# Patient Record
Sex: Female | Born: 1940 | Race: White | Hispanic: No | Marital: Married | Smoking: Former smoker
Health system: Southern US, Community
[De-identification: ages and names within clinical notes are randomized; demographics above are authoritative.]

## PROBLEM LIST (undated history)

## (undated) DIAGNOSIS — N200 Calculus of kidney: Secondary | ICD-10-CM

## (undated) DIAGNOSIS — I10 Essential (primary) hypertension: Secondary | ICD-10-CM

## (undated) DIAGNOSIS — M87076 Idiopathic aseptic necrosis of unspecified foot: Secondary | ICD-10-CM

## (undated) DIAGNOSIS — J80 Acute respiratory distress syndrome: Secondary | ICD-10-CM

## (undated) DIAGNOSIS — IMO0001 Reserved for inherently not codable concepts without codable children: Secondary | ICD-10-CM

## (undated) DIAGNOSIS — J13 Pneumonia due to Streptococcus pneumoniae: Secondary | ICD-10-CM

## (undated) DIAGNOSIS — K219 Gastro-esophageal reflux disease without esophagitis: Secondary | ICD-10-CM

## (undated) DIAGNOSIS — R002 Palpitations: Secondary | ICD-10-CM

## (undated) HISTORY — DX: Calculus of kidney: N20.0

## (undated) HISTORY — DX: Acute respiratory distress syndrome: J80

## (undated) HISTORY — DX: Essential (primary) hypertension: I10

## (undated) HISTORY — PX: NASAL SINUS SURGERY: SHX719

## (undated) HISTORY — DX: Pneumonia due to Streptococcus pneumoniae: J13

## (undated) HISTORY — PX: COLONOSCOPY: SHX174

## (undated) HISTORY — DX: Gastro-esophageal reflux disease without esophagitis: K21.9

## (undated) HISTORY — DX: Palpitations: R00.2

## (undated) HISTORY — PX: TONSILLECTOMY: SUR1361

## (undated) HISTORY — DX: Reserved for inherently not codable concepts without codable children: IMO0001

## (undated) HISTORY — DX: Idiopathic aseptic necrosis of unspecified foot: M87.076

---

## 1997-08-05 ENCOUNTER — Other Ambulatory Visit: Admission: RE | Admit: 1997-08-05 | Discharge: 1997-08-05 | Payer: Self-pay | Admitting: Obstetrics & Gynecology

## 1998-10-06 ENCOUNTER — Other Ambulatory Visit: Admission: RE | Admit: 1998-10-06 | Discharge: 1998-10-06 | Payer: Self-pay | Admitting: Obstetrics and Gynecology

## 1999-12-04 ENCOUNTER — Other Ambulatory Visit: Admission: RE | Admit: 1999-12-04 | Discharge: 1999-12-04 | Payer: Self-pay | Admitting: Obstetrics and Gynecology

## 2000-09-17 ENCOUNTER — Encounter: Payer: Self-pay | Admitting: Gastroenterology

## 2000-09-17 ENCOUNTER — Encounter: Admission: RE | Admit: 2000-09-17 | Discharge: 2000-09-17 | Payer: Self-pay | Admitting: Gastroenterology

## 2000-12-04 ENCOUNTER — Other Ambulatory Visit: Admission: RE | Admit: 2000-12-04 | Discharge: 2000-12-04 | Payer: Self-pay | Admitting: Obstetrics and Gynecology

## 2001-12-16 ENCOUNTER — Other Ambulatory Visit: Admission: RE | Admit: 2001-12-16 | Discharge: 2001-12-16 | Payer: Self-pay | Admitting: Obstetrics and Gynecology

## 2002-09-21 ENCOUNTER — Encounter: Payer: Self-pay | Admitting: Internal Medicine

## 2002-09-24 ENCOUNTER — Ambulatory Visit (HOSPITAL_COMMUNITY): Admission: RE | Admit: 2002-09-24 | Discharge: 2002-09-24 | Payer: Self-pay | Admitting: Gastroenterology

## 2002-09-24 ENCOUNTER — Encounter (INDEPENDENT_AMBULATORY_CARE_PROVIDER_SITE_OTHER): Payer: Self-pay | Admitting: Specialist

## 2002-10-01 ENCOUNTER — Encounter: Admission: RE | Admit: 2002-10-01 | Discharge: 2002-10-01 | Payer: Self-pay | Admitting: Gastroenterology

## 2002-10-01 ENCOUNTER — Encounter: Payer: Self-pay | Admitting: Gastroenterology

## 2002-11-24 ENCOUNTER — Ambulatory Visit (HOSPITAL_COMMUNITY): Admission: RE | Admit: 2002-11-24 | Discharge: 2002-11-24 | Payer: Self-pay | Admitting: Gastroenterology

## 2002-11-25 ENCOUNTER — Encounter: Admission: RE | Admit: 2002-11-25 | Discharge: 2002-11-25 | Payer: Self-pay | Admitting: Gastroenterology

## 2002-12-18 ENCOUNTER — Other Ambulatory Visit: Admission: RE | Admit: 2002-12-18 | Discharge: 2002-12-18 | Payer: Self-pay | Admitting: Obstetrics and Gynecology

## 2003-12-30 ENCOUNTER — Ambulatory Visit: Payer: Self-pay | Admitting: Internal Medicine

## 2004-01-13 ENCOUNTER — Other Ambulatory Visit: Admission: RE | Admit: 2004-01-13 | Discharge: 2004-01-13 | Payer: Self-pay | Admitting: Obstetrics and Gynecology

## 2004-02-10 ENCOUNTER — Ambulatory Visit: Payer: Self-pay | Admitting: Internal Medicine

## 2004-03-17 ENCOUNTER — Ambulatory Visit (HOSPITAL_COMMUNITY): Admission: RE | Admit: 2004-03-17 | Discharge: 2004-03-17 | Payer: Self-pay | Admitting: Gastroenterology

## 2004-03-17 ENCOUNTER — Encounter (INDEPENDENT_AMBULATORY_CARE_PROVIDER_SITE_OTHER): Payer: Self-pay | Admitting: Specialist

## 2005-05-02 ENCOUNTER — Ambulatory Visit: Payer: Self-pay | Admitting: Internal Medicine

## 2007-02-17 ENCOUNTER — Telehealth (INDEPENDENT_AMBULATORY_CARE_PROVIDER_SITE_OTHER): Payer: Self-pay | Admitting: *Deleted

## 2007-05-31 ENCOUNTER — Encounter: Payer: Self-pay | Admitting: Internal Medicine

## 2007-05-31 DIAGNOSIS — J479 Bronchiectasis, uncomplicated: Secondary | ICD-10-CM

## 2007-07-03 ENCOUNTER — Encounter: Admission: RE | Admit: 2007-07-03 | Discharge: 2007-07-03 | Payer: Self-pay | Admitting: Orthopedic Surgery

## 2008-01-09 ENCOUNTER — Telehealth: Payer: Self-pay | Admitting: Internal Medicine

## 2008-01-12 ENCOUNTER — Ambulatory Visit: Payer: Self-pay | Admitting: Internal Medicine

## 2008-01-19 ENCOUNTER — Telehealth (INDEPENDENT_AMBULATORY_CARE_PROVIDER_SITE_OTHER): Payer: Self-pay | Admitting: *Deleted

## 2008-03-25 ENCOUNTER — Telehealth (INDEPENDENT_AMBULATORY_CARE_PROVIDER_SITE_OTHER): Payer: Self-pay | Admitting: *Deleted

## 2008-05-04 ENCOUNTER — Telehealth (INDEPENDENT_AMBULATORY_CARE_PROVIDER_SITE_OTHER): Payer: Self-pay | Admitting: *Deleted

## 2008-05-12 ENCOUNTER — Telehealth (INDEPENDENT_AMBULATORY_CARE_PROVIDER_SITE_OTHER): Payer: Self-pay | Admitting: *Deleted

## 2008-12-15 ENCOUNTER — Telehealth: Payer: Self-pay | Admitting: Internal Medicine

## 2009-01-20 ENCOUNTER — Ambulatory Visit: Payer: Self-pay | Admitting: Internal Medicine

## 2009-07-19 ENCOUNTER — Telehealth (INDEPENDENT_AMBULATORY_CARE_PROVIDER_SITE_OTHER): Payer: Self-pay | Admitting: *Deleted

## 2009-07-21 ENCOUNTER — Ambulatory Visit: Payer: Self-pay | Admitting: Internal Medicine

## 2009-07-22 ENCOUNTER — Telehealth (INDEPENDENT_AMBULATORY_CARE_PROVIDER_SITE_OTHER): Payer: Self-pay | Admitting: *Deleted

## 2009-10-10 ENCOUNTER — Encounter: Payer: Self-pay | Admitting: Internal Medicine

## 2009-11-17 ENCOUNTER — Ambulatory Visit: Payer: Self-pay | Admitting: Internal Medicine

## 2009-11-17 DIAGNOSIS — R9431 Abnormal electrocardiogram [ECG] [EKG]: Secondary | ICD-10-CM

## 2009-11-17 DIAGNOSIS — R002 Palpitations: Secondary | ICD-10-CM

## 2009-12-12 ENCOUNTER — Ambulatory Visit: Payer: Self-pay

## 2009-12-12 ENCOUNTER — Ambulatory Visit: Payer: Self-pay | Admitting: Cardiovascular Disease

## 2009-12-12 ENCOUNTER — Encounter: Payer: Self-pay | Admitting: Internal Medicine

## 2009-12-12 ENCOUNTER — Ambulatory Visit (HOSPITAL_COMMUNITY)
Admission: RE | Admit: 2009-12-12 | Discharge: 2009-12-12 | Payer: Self-pay | Source: Home / Self Care | Admitting: Internal Medicine

## 2010-02-19 ENCOUNTER — Encounter: Payer: Self-pay | Admitting: Gastroenterology

## 2010-03-02 NOTE — Letter (Signed)
Summary: [facility] Echo, Exercise Stress Test, 12 Lead EKG 2004-20  [facility] Echo, Exercise Stress Test, 12 Lead EKG 2004-2005   Imported By: Roderic Ovens 11/18/2009 10:54:54  _____________________________________________________________________  External Attachment:    Type:   Image     Comment:   External Document

## 2010-03-02 NOTE — Letter (Signed)
Summary: [facility] Annual Physical Exam Note   [facility] Annual Physical Exam Note   Imported By: Roderic Ovens 11/17/2009 13:27:15  _____________________________________________________________________  External Attachment:    Type:   Image     Comment:   External Document

## 2010-03-02 NOTE — Progress Notes (Signed)
Summary: refills  Phone Note Call from Patient Call back at Home Phone [phone]   Caller: Patient Call For: wert Reason for Call: Refill Medication Summary of Call: Needs refills on advair 250-22mcg(generic), advair 100-7mcg(generic).Fax to [facility] at [phone] and use order [phone]. Initial call taken by: Darletta Moll,  July 19, 2009 4:52 PM  Follow-up for Phone Call        called and spoke with pt and informed her unable to send RX to pharmacy as we haven't seen her since 2007!!!!!!  Pt states "discuss this with DR Sherene Sires as this is a Personnel officer and he will send rx for me."  Will forward message to MW to address.  Please advise.  Aundra Millet Reynolds LPN  July 19, 2009 5:06 PM  It's fine, she's following up with Dr Alwyn Ren who could also refill it next time so she doesn't doesn't have to jump over  these hurdles Follow-up by: Nyoka Cowden MD,  July 20, 2009 8:41 AM  Additional Follow-up for Phone Call Additional follow up Details #1::        Ginny simply needs to make appt with Kathlene November or me. The Medical Board will not allow refills without F/U Additional Follow-up by: Marga Melnick MD,  July 20, 2009 8:52 AM    Additional Follow-up for Phone Call Additional follow up Details #2::    Spoke with pt and advised per Drs Alwyn Ren and Sherene Sires, we need to just sched her an appt for these refills.  Pt states that she will have to look at her sched and will call back when is ready to sched appt. Follow-up by: Vernie Murders,  July 20, 2009 9:38 AM

## 2010-03-02 NOTE — Progress Notes (Signed)
Summary: [facility] Medical History/Med List   [facility] Medical History/Med List   Imported By: Roderic Ovens 11/17/2009 14:20:33  _____________________________________________________________________  External Attachment:    Type:   Image     Comment:   External Document

## 2010-03-02 NOTE — Progress Notes (Signed)
  Phone Note Call from Patient   Caller: Patient Summary of Call: pt calling to give the correct order number which is [phone] for advair 100/50 and 250/50 Initial call taken by: Rickard Patience,  July 22, 2009 1:39 PM  Follow-up for Phone Call        Rx was refaxed with the correct confirmation #.  Pt aware. Follow-up by: Vernie Murders,  July 22, 2009 2:35 PM

## 2010-03-02 NOTE — Assessment & Plan Note (Signed)
Summary: nep/qt changes/jml   Visit Type:  new pt Primary Jaime Fitzpatrick:  Dr. Felipa Eth  CC:  shortness of breath.  History of Present Illness: Jaime Fitzpatrick is seen at the request of Dr.AVVA because of palpitations and an abnormal electrocardiogram.  She is a complex past medical history. 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 She suffered developed bronchiectasis.  She has a history of "mitral valve prolapse" which was poopooed by Dr. Katrinka Blazing when he saw an echo on her in 2004 at which time she also had a Myoview scan done; as best as we know these are normal these results are pending at this time.  She has dyspnea on exertion which has been intubated to her lung disease. She is vague nonspecific chest tightness which is also described to her lung disease; it is not necessarily exertional. She is more recently developed palpitations which he describes as a thud.  It is associated with a pause in her heartbeat. It is not associated with a fluttering sensation. It is much more noticeable when she is recumbent or on her left side and at night.  She does not and has not use caffeine for many many years dating back to the initial diagnosis of mitral valve prolapse in the 1980s. At that time she was tried on Inderal which he took for a number of months and a combination of the medication and the discontinuation of caffeine resulted in resolution of her symptoms.  Current Medications (verified): 1)  Advair Diskus 250-50 Mcg/dose Misc (Fluticasone-Salmeterol) .... Inhale 1 Puff As Directed Once A Day 2)  Micardis 80 Mg Tabs (Telmisartan) .Marland Kitchen.. 1 Once Daily 3)  Nasonex 50 Mcg/act  Susp (Mometasone Furoate) .Marland Kitchen.. 1 To 2 Sprays Each Nostril At Bedtime 4)  Glucosamine Chondroitin Adv   Tabs (Misc Natural Products) .Marland Kitchen.. 1 Two Times A Day 5)  Adult Aspirin Ec Low  Strength 81 Mg  Tbec (Aspirin) .Marland Kitchen.. 1 Once Daily 6)  Advair Diskus 100-50 Mcg/dose Misc (Fluticasone-Salmeterol) .Marland Kitchen.. 1 Puff At Bedtime 7)  Mucinex 600 Mg Xr12h-Tab (Guaifenesin) .... 2 Two Times A Day 8)  Advair Diskus 100-50 Mcg/dose  Misc (Fluticasone-Salmeterol) .... One Puff Twice Daily 9)  Daily Multiple Vitamins  Tabs (Multiple Vitamin) .... Once Daily 10)  B Complex  Tabs (B Complex Vitamins) .... Once Daily 11)  Poly-Vitamin  Soln (Pediatric Multiple Vit-Vit C) .... Once Daily 12)  Multi-Day/calcium/extra Iron  Tabs (Multiple Vitamins-Calcium) .... Once Daily 13)  Fish Oil 1000 Mg Caps (Omega-3 Fatty Acids) .... Once Daily 14)  Primsol 50 Mg/31ml Soln (Trimethoprim Hcl) .... As Needed  Allergies (verified): 1)  Amoxicillin (Amoxicillin) 2)  Erythromycin Ethylsuccinate 3)  Tylenol/codeine #3 (Acetaminophen-Codeine)  Past History:  Family History: Last updated: 07/21/2009 Heart dz- Father Allergies- Father  Social History: Last updated: 07/21/2009 Married Homemaker Former smoker.  Smoked approx 1 yr socailly in 1968. Social ETOH  Risk Factors: Smoking Status: quit (05/31/2007)  Past Medical History: Pneumococcal pneumonia ARDS Bronchiectasis Aseptic necrosis of the foot GE reflux disease Nephrolithiasis Palpitations hypertension Allergic diathesis  Health Maintenance.................................................................Marland KitchenMarland KitchenAvva      - Pneumovax 12/2008  Past Surgical History: Colonoscopy tonsillectomy Sinus surgery  Review of Systems       full review of systems was negative apart from a history of present illness and past medical history recurrent UTIs, asthma, and shoulder arthritis.  Vital Signs:  Patient  profile:   70 year old female Height:      69 inches Weight:      136.75 pounds BMI:     20.27 Pulse rate:   82 / minute BP sitting:   124 / 86  (left arm) Cuff size:   regular  Vitals Entered By: Caralee Ates CMA (November 17, 2009  3:17 PM)  Physical Exam  General:  Well developed, well nourishedolder Caucasian female appearing her stated age in no acute distress. Head:  normal HEENT Neck:  supple without thyromegaly 6-7 cm JVP carotids brisk and full without bruits Chest Wall:  without kyphosis scoliosis Lungs:  clear to auscultation Heart:  regular rate and rhythm with a 2-3/6 systolic murmur her lungs are right sternal border;  it is not clear that S2 split varies Abdomen:  soft with active bowel sounds there is a prominent epigastric mid line pulsation. It is unassociated with heart sounds. Msk:  patient wears a orthotic brace in her right leg because of aseptic necrosis of her bones and her right foot Pulses:  intact distal pulses Extremities:  no clubbing cyanosis or edema Neurologic:  grossly normal Skin:  warm and dry Cervical Nodes:  without adenopathy Psych:  engaging affect   Impression & Recommendations:  Problem # 1:  ELECTROCARDIOGRAM, ABNORMAL (ICD-794.31)  She has poor R-wave progression ; left axis and incomplete right bundle the lateral which was not evident on her referring physician Philip the cardiogram with purulent QT prolongation. 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 Her echo from 2004 is not yet available. We will plan to repeat it looking for left ventricular function as well as a bubble study looking to see whether there is evidence of an ASD of the secundum type.  in the absence of symptoms, her borderline QT prolongation I don't think is sufficient enough to justify pursuing a genetic diagnosis. It may be reasonable to try to restrict QT prolonging drugs. Her updated medication list for this problem includes:    Adult Aspirin Ec Low Strength 81 Mg Tbec (Aspirin) .Marland Kitchen... 1 once daily  Orders: Abdominal Aorta Duplex (Abd Aorta  Duplex) Echocardiogram (Echo)  Problem # 2:  ABDOMINAL MASS (ICD-789.30) her abdominal fullness which is pulsatile is unassociated with heart sounds but I still suspect that it is likely related to inferior displacement of the heart as noted above. There is no family history of aortic disease. We'll obtain an ultrasound  Problem # 3:  PALPITATIONS (ICD-785.1)  her palpitations sound as if they are PVCs. They are isolated and the arrhythmic. We can begin a low-dose beta blocker in the event that they continue to be perturbing Her updated medication list for this problem includes:    Adult Aspirin Ec Low Strength 81 Mg Tbec (Aspirin) .Marland Kitchen... 1 once daily  Orders: Abdominal Aorta Duplex (Abd Aorta Duplex) Echocardiogram (Echo)  Patient Instructions: 1)  Your physician recommends that you continue on your current medications as directed. Please refer to the Current Medication list given to you today. 2)  Your physician has requested that you have an abdominal aorta duplex. During this test, an ultrasound is used to evaluate the aorta. Allow 30 minutes for this exam. Do not eat after midnight the day before and avoid carbonated beverages. There are no restrictions or special instructions. 3)  Your physician has requested that you have an  echocardiogram with bubble study.  Echocardiography is a painless test that uses sound waves to create images of your heart. It provides your doctor with information about the size and shape of your heart and how well your heart's chambers and valves are working.  This procedure takes approximately one hour. There are no restrictions for this procedure.

## 2010-03-02 NOTE — Assessment & Plan Note (Signed)
Summary: Pulmonary/ f/u bronchiectasis   Primary Provider/Referring Provider:  Dr. Felipa Eth  CC:  Followup for adviar rx.  Pt last seen April 2007.  Pt states that her breathing has overall been okay.  She denies any complaints..  History of Present Illness: 33 yowf never regular smoker with Bronchiectasis/ab after ARDS age 70 from Pneumoccal sepsis.  July 21, 2009 ov doing well on unusual combo of advair 250 in am and 100 in pm.  Pt denies any significant sore throat, dysphagia, itching, sneezing,  nasal congestion or excess secretions,  fever, chills, sweats, unintended wt loss, pleuritic or exertional cp, hempoptysis, change in activity tolerance  orthopnea pnd or leg swelling  Pt also denies any obvious fluctuation in symptoms with weather or environmental change or other alleviating or aggravating factors.        Current Medications (verified): 1)  Advair Diskus 250-50 Mcg/dose Misc (Fluticasone-Salmeterol) .... Inhale 1 Puff As Directed Once A Day 2)  Micardis 80 Mg Tabs (Telmisartan) .Marland Kitchen.. 1 Once Daily 3)  Nasonex 50 Mcg/act  Susp (Mometasone Furoate) .Marland Kitchen.. 1 To 2 Sprays Each Nostril At Bedtime 4)  Glucosamine Chondroitin Adv   Tabs (Misc Natural Products) .Marland Kitchen.. 1 Two Times A Day 5)  Adult Aspirin Ec Low Strength 81 Mg  Tbec (Aspirin) .Marland Kitchen.. 1 Once Daily 6)  Advair Diskus 100-50 Mcg/dose Misc (Fluticasone-Salmeterol) .Marland Kitchen.. 1 Puff At Bedtime 7)  Mucinex 600 Mg Xr12h-Tab (Guaifenesin) .... 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  Smoked approx 1 yr socailly in 1968. Social ETOH  Review of Systems       The patient complains of shortness of breath with activity, productive cough, indigestion, and change in color of mucus.  The patient denies shortness of breath at rest, non-productive cough, coughing up blood, chest pain, irregular heartbeats, acid heartburn, loss of appetite, weight change, abdominal pain, difficulty swallowing, sore throat, tooth/dental problems, headaches, nasal congestion/difficulty breathing through nose, sneezing, itching, ear ache, anxiety, depression, hand/feet swelling, joint stiffness or pain, rash, and fever.    Vital Signs:  Patient profile:   70 year old female Height:      69 inches Weight:      141 pounds BMI:     20.90 O2 Sat:      97 % on Room air Temp:     97.7 degrees F oral Pulse rate:   86 / minute BP sitting:   114 / 70  (left arm)  Vitals Entered By: Vernie Murders (July 21, 2009 4:32 PM)  O2 Flow:  Room air  Physical Exam  Additional Exam:  142 > 141 July 21, 2009 HEENT mild turbinate edema.  Oropharynx no thrush or excess pnd or cobblestoning.  No JVD or cervical adenopathy. Mild accessory muscle hypertrophy. Trachea midline, nl thryroid. Chest was hyperinflated by percussion with diminished breath sounds and moderate increased exp time without wheeze. Hoover sign positive at mid inspiration. Regular rate and rhythm without murmur gallop or rub or increase P2 or edema.  Abd: no hsm, nl excursion. Ext warm without cyanosis or clubbing.     Impression & Recommendations:  Problem # 1:  BRONCHIECTASIS (ICD-494.0) Rx airways to optimize mucociliary  fxn with advair but probably only needs 100/50 two times a day .  Pneumovax at age 41 so probably not indicated again  F/u can be per Dr Felipa Eth and as needed in pulmonary clinic  Medications Added to Medication List This Visit: 1)  Advair Diskus 250-50 Mcg/dose Misc  (Fluticasone-salmeterol) .... Inhale 1 puff as directed once a day 2)  Micardis 80 Mg Tabs (Telmisartan) .Marland Kitchen.. 1 once daily 3)  Nasonex 50 Mcg/act Susp (Mometasone furoate) .Marland Kitchen.. 1 to 2 sprays each nostril at bedtime 4)  Glucosamine Chondroitin Adv Tabs (Misc natural products) .Marland Kitchen.. 1 two times a day 5)  Adult Aspirin Ec Low Strength 81 Mg Tbec (Aspirin) .Marland Kitchen.. 1 once daily 6)  Advair Diskus 100-50 Mcg/dose Misc (Fluticasone-salmeterol) .Marland Kitchen.. 1 puff at bedtime 7)  Mucinex 600 Mg Xr12h-tab (Guaifenesin) .... 2 two times a day 8)  Advair Diskus 100-50 Mcg/dose Misc (Fluticasone-salmeterol) .... One puff twice daily  Other Orders: Est. Patient Level III (16109) Prescription Created Electronically [phone])  Patient Instructions: 1)  Try advair 100/50 one twice daily and if flare then resume the 250/50 in am and 100/50 in pm 2)  Dr Felipa Eth can refill these in the future and just see [place] if not doing well on this regimen Prescriptions: ADVAIR DISKUS 100-50 MCG/DOSE  MISC (FLUTICASONE-SALMETEROL) One puff twice daily  #1 x 11   Entered and Authorized by:   Nyoka Cowden MD   Signed by:   Nyoka Cowden MD on 07/21/2009   Method used:   Electronically to        [facility]. [phone]* (retail)       [address]       [place], [place]  [address]       Ph: [phone]       Fax: [phone]   RxID:   [id number] ADVAIR DISKUS 100-50 MCG/DOSE MISC (FLUTICASONE-SALMETEROL) 1 puff at bedtime  #3 x 3   Entered and Authorized by:   Nyoka Cowden MD   Signed by:   Nyoka Cowden MD on 07/21/2009   Method used:   Printed then faxed to ...       [facility]  [place]. [phone]* (retail)       [address]       [place], [place]  [address]       Ph: [phone]       Fax: [phone]   RxID:   [id number] ADVAIR DISKUS 250-50 MCG/DOSE MISC (FLUTICASONE-SALMETEROL) Inhale 1 puff as directed once a day  #3 x 4   Entered and Authorized by:    Nyoka Cowden MD   Signed by:   Nyoka Cowden MD on 07/21/2009   Method used:   Printed then faxed to ...       [facility]  [place] [phone]* (retail)       [address]       [place], [place]  [address]       Ph: [phone]       Fax: [phone]   RxID:   [id number]

## 2010-06-16 NOTE — Op Note (Signed)
   Jaime Fitzpatrick, Jaime Fitzpatrick                        ACCOUNT NO.:  [id number]   MEDICAL RECORD NO.:  [id number]                   PATIENT TYPE:  AMB   LOCATION:  ENDO                                 FACILITY:  [facility]   PHYSICIAN:  Petra Kuba, M.D.                 DATE OF BIRTH:  [birth year]   DATE OF PROCEDURE:  09/24/2002  DATE OF DISCHARGE:                                 OPERATIVE REPORT   PROCEDURE:  Esophagogastroduodenoscopy with biopsy.   INDICATIONS:  Upper tract symptoms with abdominal pain.  Consent was signed  after risks, benefits, methods, options were thoroughly discussed multiple  times in the past.   MEDICATIONS:  Demerol 60 mg, Versed 6.   DESCRIPTION OF PROCEDURE:  The video endoscope was inserted by direct  vision.  The esophagus were normal.  In the distal esophagus was a tiny  hiatal hernia.  The scope passed into the stomach, advanced through the  antrum, where there was some very minimal antritis, advanced through a  normal pylorus into a normal duodenal bulb, around the C-loop to a normal  second portion of the duodenum.  The scope was withdrawn back to the bulb  and a good look there ruled out abnormalities in that location.  Scope was  withdrawn back to the stomach and retroflexed.  Annularis, cardia, fundus  were all normal.  Lesser and greater curve were normal on retroflexed  visualization.  Straight visualization in the stomach confirmed some minimal  gastritis, ruled out any other abnormalities.  Two biopsies of the antrum  and two of the proximal stomach were obtained to confirm the gastritis, rule  out Helicobacter.  Air was suctioned.  The scope was then slowly withdrawn.  Again a good look at the esophagus was normal.  The scope was removed.  The  patient tolerated the procedure well.  There was no obvious immediate  complication.   ENDOSCOPIC DIAGNOSES:  1. Tiny hiatal hernia.  2. Minimal antritis/gastritis as was biopsied.  3. Otherwise within  normal limits esophagogastroduodenoscopy.   PLAN:  Await pathology.  Continue Pepcid.  Upgrade to pump inhibitors p.r.n.  He had at one time abdominal ultrasound just to be sure gallbladder problems  are not playing a role.  Call me p.r.n.                                                Petra Kuba, M.D.    MEM/MEDQ  D:  09/24/2002  T:  09/24/2002  Job:  [id number]   cc:   Gita Kudo, M.D.  [address]  [place]  [place] [address]  Fax: [phone]

## 2010-06-16 NOTE — Op Note (Signed)
   NAMEANESSIA, OAKLAND                        ACCOUNT NO.:  [id number]   MEDICAL RECORD NO.:  [id number]                   PATIENT TYPE:  AMB   LOCATION:  ENDO                                 FACILITY:  [facility]   PHYSICIAN:  Petra Kuba, M.D.                 DATE OF BIRTH:  09-02-1940   DATE OF PROCEDURE:  11/24/2002  DATE OF DISCHARGE:                                 OPERATIVE REPORT   PROCEDURE:  Colonoscopy.   INDICATIONS:  Screening.  Consent was signed after risks, benefits, methods,  and options thoroughly discussed in the past.   MEDICINES USED:  Demerol 100 mg, Versed 10 mg.   DESCRIPTION OF PROCEDURE:  Rectal inspection was pertinent for external  hemorrhoids.  Digital exam was negative.  The video pediatric adjustable  colonoscope was inserted and with lots of difficulty was able to be advanced  to probably the hepatic flexure.  This did require multiple abdominal  pressures and rolling her in multiple positions.  Unfortunately, despite our  efforts of about an hour and a half, we could not advance any further.  We  did have multiple withdrawals to try to get the loops out and multiple  position changes.  On slow withdrawal, no abnormalities were seen.  The prep  was adequate.  There was minimal liquid stool that required washing and  suctioning.  Anorectal pull-through and retroflexion confirmed some small  hemorrhoids.  The scope was reinserted a short way up the left side of the  colon, air was suctioned, the scope removed.  The patient tolerated the  procedure well.  There was no obvious immediate complication.   ENDOSCOPIC DIAGNOSES:  1. Internal-external hemorrhoids.  2. Very tortuous, long, looping colon.  3. Exam to questionably the hepatic flexure.    PLAN:  Will consider air contrast barium enema or virtual colonoscopy,  unfortunately, having trouble setting up the later, or will wait a year  until more widely available and proceed at that juncture.   Otherwise, yearly  rectals and guaiacs per primary care or gynecology.                                                Petra Kuba, M.D.    MEM/MEDQ  D:  11/24/2002  T:  11/24/2002  Job:  [id number]   cc:   Charlaine Dalton. Sherene Sires, M.D. [facility]   Gita Kudo, M.D.  [address]  [place]  [place] [address]  Fax: [phone]

## 2010-06-16 NOTE — Op Note (Signed)
NAMEANASTYN, Jaime Fitzpatrick              ACCOUNT NO.:  [id number]   MEDICAL RECORD NO.:  [id number]          PATIENT TYPE:  AMB   LOCATION:  ENDO                         FACILITY:  [facility]   PHYSICIAN:  Petra Kuba, M.D.    DATE OF BIRTH:  February 22, 1940   DATE OF PROCEDURE:  03/17/2004  DATE OF DISCHARGE:                                 OPERATIVE REPORT   PROCEDURE:  Colonoscopy with polypectomy.   INDICATION:  The patient with history of abnormal virtual colonoscopy and an  unsuccessful colonoscopy in the past.  Want to repeat to rule out polyps.  Consent was signed after risks, benefits, methods, options thoroughly  discussed in the office in the past.   MEDICINES USED:  1.  Demerol 100.  2.  Versed 10.   DESCRIPTION OF PROCEDURE:  Rectal inspection was pertinent for external  hemorrhoids.  Digital exam was negative.  Video pediatric adjustable  colonoscope was inserted and again with a moderate amount of difficulty due  to a long, looping, tortuous colon with rolling her on her back and some  lower suprapubic pressure, we were able to advance around the colon to the  cecum.  On insertion, no abnormalities were seen.  The cecum was identified  by the appendiceal orifice and the ileocecal valve.  In fact, the scope was  inserted a short ways into the terminal ileum which was normal.  Photodocumentation was obtained.  The scope was slowly withdrawn.  Prep was  adequate.  There was some liquid stool that required washing and suctioning.  In the proximal ascending, one fold above the ileocecal valve, a small  pedunculated polyp was seen, snared, electrocautery applied, and the polyp  was removed, suctioned through the scope and collected in the trap.  There  was a nice, white coagulum without any residual polypoid tissue.  The scope  was further withdrawn.  In the mid transverse, there was a tiny polyp which  was hot biopsied x 1 and put in a separate container.  The scope was further  withdrawn.  No other polypoid lesions were seen as we slowly withdrew back  to the rectum.  When we fell back around a tortuous loop, we did try to  readvance around the curves to decrease chances of missing things.  Anorectal pull-through and retroflexion confirmed some small hemorrhoids.  The scope was straightened, readvanced a short ways up the left side of the  colon; air was suctioned, the scope removed.  The patient tolerated the  procedure well.  There was no obvious immediate complication.   ENDOSCOPIC DIAGNOSES:  1.  Internal/external hemorrhoids.  2.  Tortuous, long, looping colon.  3.  Descending with small polyps there.  4.  Mid transverse tiny polyp, hot biopsied.  5.  Otherwise, within normal limits to the terminal ileum.   PLAN:  Await pathology to determine future colonic screening.  Might  consider repeat virtual colonoscopy in 5 years if __________ covered and  widely utilized or a repeat colonoscopy at that point pending pathology and  happy to see back p.r.n.      MEM/MEDQ  D:  03/17/2004  T:  03/17/2004  Job:  [id number]   cc:   Larina Earthly, M.D.  [address]  [place]  [place] [address]  Fax: [phone]

## 2010-06-19 ENCOUNTER — Other Ambulatory Visit: Payer: Self-pay | Admitting: Gastroenterology

## 2010-06-23 ENCOUNTER — Ambulatory Visit
Admission: RE | Admit: 2010-06-23 | Discharge: 2010-06-23 | Disposition: A | Discharge status: Home / Self Care | Payer: Medicare Other | Source: Ambulatory Visit | Attending: Gastroenterology | Admitting: Gastroenterology

## 2010-06-23 MED ORDER — IOHEXOL 300 MG/ML  SOLN
100.0000 mL | Freq: Once | INTRAMUSCULAR | Status: AC | PRN
  Administered 2010-06-23: 100 mL via INTRAVENOUS

## 2010-10-23 ENCOUNTER — Telehealth: Payer: Self-pay | Admitting: Internal Medicine

## 2010-10-23 NOTE — Telephone Encounter (Signed)
LOV faxed to Dr.Avva @ [phone]   10/23/10/km

## 2011-01-01 ENCOUNTER — Telehealth: Payer: Self-pay | Admitting: Internal Medicine

## 2011-01-01 NOTE — Telephone Encounter (Signed)
Spoke with pt. She states takes avelox prn for bronchiectasis and about 2 wks ago she needed to take a round but it did not work well for her. Had to take [facility] and this helped. She is requesting to have a different abx called in to pharm. I advised needs appt since last seen over 1 yr ago. Appt sched for 01/09/11 at 9 am.

## 2011-01-08 ENCOUNTER — Encounter: Payer: Self-pay | Admitting: Internal Medicine

## 2011-01-09 ENCOUNTER — Encounter: Payer: Self-pay | Admitting: Internal Medicine

## 2011-01-09 ENCOUNTER — Ambulatory Visit (INDEPENDENT_AMBULATORY_CARE_PROVIDER_SITE_OTHER)
Admission: RE | Admit: 2011-01-09 | Discharge: 2011-01-09 | Disposition: A | Discharge status: Home / Self Care | Payer: Medicare Other | Source: Ambulatory Visit | Attending: Internal Medicine | Admitting: Internal Medicine

## 2011-01-09 ENCOUNTER — Ambulatory Visit (INDEPENDENT_AMBULATORY_CARE_PROVIDER_SITE_OTHER): Payer: Medicare Other | Admitting: Internal Medicine

## 2011-01-09 VITALS — BP 116/74 | HR 95 | Temp 98.1°F | Ht 69.0 in | Wt 137.6 lb

## 2011-01-09 DIAGNOSIS — J479 Bronchiectasis, uncomplicated: Secondary | ICD-10-CM

## 2011-01-09 MED ORDER — LEVOFLOXACIN 750 MG PO TABS: 750.0000 mg | ORAL_TABLET | Freq: Every day | ORAL | Status: AC

## 2011-01-09 NOTE — Patient Instructions (Signed)
Please remember to go to the  x-ray department downstairs for your tests - we will call you with the results when they are available.     

## 2011-01-09 NOTE — Progress Notes (Signed)
Subjective:     Patient ID: Jaime Fitzpatrick, female   DOB: Jul 18, 1940, 70 y.o.   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   July 21, 2009 ov doing well on unusual combo of advair 250 in am and 100 in pm.  rec no change rx  01/09/2011 ov/ Bilal Manzer cc no change mild doe, no limited. Intermittent flares of cough with green sputum, most recently abrupt onset while on trip to [place] with fever/maialise  did not respond to avelox, did fine with ketek from [place] source.    At present Sleeping ok without nocturnal  or early am exacerbation  of respiratory  c/o's or need for noct saba. Also denies any obvious fluctuation of symptoms with weather or environmental changes or other aggravating or alleviating factors except as outlined above      ROS  neg for  any significant sore throat, dysphagia, itching, sneezing,  nasal congestion or excess/ purulent secretions,  fever, chills, sweats, unintended wt loss, pleuritic or exertional cp, hempoptysis, orthopnea pnd or leg swelling.  Also denies presyncope, palpitations, heartburn, abdominal pain, nausea, vomiting, diarrhea  or change in bowel or urinary habits, dysuria,hematuria,  rash, arthralgias, visual complaints, headache, numbness weakness or ataxia.     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  ETOH socially   Review of Systems     Objective:   Physical Exam     Wt  141 July 21, 2009 >  137 01/09/2011  HEENT mild turbinate edema. Oropharynx no thrush or excess pnd or cobblestoning. No JVD or cervical  adenopathy. Mild accessory muscle hypertrophy. Trachea midline, nl thryroid. Chest was hyperinflated by percussion with diminished breath sounds and moderate increased exp time without wheeze. Hoover sign positive at mid inspiration. Regular rate and rhythm without murmur gallop or rub or increase P2 or edema. Abd: no hsm, nl excursion. Ext warm without cyanosis or clubbing.   CXR  01/09/2011 :  Marked pleuroparenchymal thickening at the left lung apex, greater than the right. There are no prior studies for comparison. Consider follow-up chest CT with contrast for complete evaluation. Findings may reflect scarring from prior infection. 2. Bronchial wall thickening at the lung bases likely corresponds to the bronchial wall thickening and bronchiectasis seen on prior chest CT.   Assessment:         Plan:

## 2011-01-10 ENCOUNTER — Encounter: Payer: Self-pay | Admitting: Internal Medicine

## 2011-01-10 NOTE — Assessment & Plan Note (Signed)
Adequate control on present rx, reviewed options for abx - the problem with Jaime Fitzpatrick is that is doesn't cover any of the gnr's assoc with typical bronchiectasis flares ? Pseudomonas worse case scenario but cipro doesn't cover Resistant pneumococci Discussed in detail all the  indications, usual  risks and alternatives  relative to the benefits with patient who agrees to proceed with bronchoscopy with short course high dose levaquin with next flare  In meantime, the obstructive and inflammatory component of the problem appear well controlled with advair albeit in an unusual dosing pattern, also reviewed    Each maintenance medication was reviewed in detail including most importantly the difference between maintenance and as needed and under what circumstances the prns are to be used.  Please see instructions for details which were reviewed in writing and the patient given a copy.

## 2011-04-12 ENCOUNTER — Ambulatory Visit (INDEPENDENT_AMBULATORY_CARE_PROVIDER_SITE_OTHER): Payer: Medicare Other | Admitting: Internal Medicine

## 2011-04-12 ENCOUNTER — Ambulatory Visit (INDEPENDENT_AMBULATORY_CARE_PROVIDER_SITE_OTHER)
Admission: RE | Admit: 2011-04-12 | Discharge: 2011-04-12 | Disposition: A | Discharge status: Home / Self Care | Payer: Medicare Other | Source: Ambulatory Visit | Attending: Internal Medicine | Admitting: Internal Medicine

## 2011-04-12 ENCOUNTER — Telehealth: Payer: Self-pay | Admitting: Internal Medicine

## 2011-04-12 ENCOUNTER — Encounter: Payer: Self-pay | Admitting: Internal Medicine

## 2011-04-12 VITALS — BP 118/74 | HR 86 | Temp 98.0°F | Ht 69.0 in | Wt 141.0 lb

## 2011-04-12 DIAGNOSIS — J479 Bronchiectasis, uncomplicated: Secondary | ICD-10-CM

## 2011-04-12 NOTE — Telephone Encounter (Signed)
I spoke with patient and advised her of MW recs. She voiced her understanding and had no questions

## 2011-04-12 NOTE — Patient Instructions (Signed)
Stop fish oil because of the risk of some of it ending up in your lung  Hold aspirin whenever you see excessive amt of bleeding x 3 days off it.  Please remember to go to the  x-ray department downstairs for your tests - we will call you with the results when they are available.

## 2011-04-12 NOTE — Progress Notes (Signed)
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   July 21, 2009 ov doing well on unusual combo of advair 250 in am and 100 in pm.  rec no change rx   04/12/2011 f/u ov/Marcele Kosta cc cough with  intermittently brown mucus esp ins am assoc with streaky hemoptysis for the past 6 months- started as blood streaks and 2 days ago produced approx 1 tbs RBR.  No abx x 4 months. On asa 81 mg daily. No sign sob or need for daytime saba   At present Sleeping ok without nocturnal  or early am exacerbation  of respiratory  c/o's or need for noct saba. Also denies any obvious fluctuation of symptoms with weather or environmental changes or other aggravating or alleviating factors except as outlined above      ROS  neg for  any significant sore throat, dysphagia, itching, sneezing,  nasal congestion or excess/ purulent secretions,  fever, chills, sweats, unintended wt loss, pleuritic or exertional cp, hempoptysis, orthopnea pnd or leg swelling.  Also denies presyncope, palpitations, heartburn, abdominal pain, nausea, vomiting, diarrhea  or change in bowel or urinary habits, dysuria,hematuria,  rash, arthralgias, visual complaints, headache, numbness weakness or ataxia.     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  ETOH socially        Objective:   Physical Exam     Wt  141 July 21, 2009 >  137 01/09/2011 > 04/12/2011  141 HEENT mild turbinate edema. Oropharynx no thrush or excess pnd or cobblestoning. No JVD  or cervical adenopathy. Mild accessory muscle hypertrophy. Trachea midline, nl thryroid. Chest was hyperinflated by percussion with diminished breath sounds and moderate increased exp time without wheeze. Hoover sign positive at mid inspiration. Regular rate and rhythm without murmur gallop or rub or increase P2 or edema. Abd: no hsm, nl excursion. Ext warm without cyanosis or clubbing.  CXR  04/12/2011 :  Again noted bronchiectasis and bronchitic changes bilateral lower lobe. Extensive bilateral apical pleuroparenchymal thickening left greater than right again noted. Additional vague small nodules are noted in the right upper lobe. Further evaluation with enhanced CT scan is again recommended.      Assessment:         Plan:

## 2011-04-12 NOTE — Telephone Encounter (Signed)
I spoke with pt and she is wanting to know when she should start an abx bc she states she was not told. Also pt is wanting to know if Dr. Sherene Sires recs she get a TDAP booster. Please advise Dr. Sherene Sires, thanks

## 2011-04-12 NOTE — Telephone Encounter (Signed)
Ok to start levaquin now No tdap until 2015 due

## 2011-04-13 NOTE — Progress Notes (Signed)
Quick Note:  Spoke with pt and notified of results per Dr. Wert. Pt verbalized understanding and denied any questions.  ______ 

## 2011-04-14 NOTE — Assessment & Plan Note (Addendum)
Classic bronchiectasis flare on asa with intermittent hemoptysis and no def/convincing evolving changes on cxr to suggest MAI, though she is at risk.  I had an extended discussion with the patient today lasting 15 to 20 minutes of a 25 minute visit on the following issues:   Discussed limited dx and need to stop asa and rx with abx when sputum changes for the worse which it clearly has at present.  Also rec stop fish oil as there is risk some of it may well end of in her airways and there's no way to rule it out - offered alternative of two servings of baked fish per week which she agreed to consider.    Each maintenance medication was reviewed in detail including most importantly the difference between maintenance and as needed and under what circumstances the prns are to be used.  Please see instructions for details which were reviewed in writing and the patient given a copy.    See instructions for specific recommendations which were reviewed directly with the patient who was given a copy with highlighter outlining the key components.

## 2012-02-26 ENCOUNTER — Ambulatory Visit
Admission: RE | Admit: 2012-02-26 | Discharge: 2012-02-26 | Disposition: A | Discharge status: Home / Self Care | Payer: Medicare Other | Source: Ambulatory Visit | Attending: Pulmonary Disease | Admitting: Pulmonary Disease

## 2012-02-26 ENCOUNTER — Encounter: Payer: Self-pay | Admitting: Pulmonary Disease

## 2012-02-26 ENCOUNTER — Telehealth: Payer: Self-pay | Admitting: Internal Medicine

## 2012-02-26 ENCOUNTER — Ambulatory Visit (INDEPENDENT_AMBULATORY_CARE_PROVIDER_SITE_OTHER): Payer: Medicare Other | Admitting: Pulmonary Disease

## 2012-02-26 VITALS — BP 122/80 | HR 100 | Temp 98.6°F | Ht 69.0 in | Wt 139.2 lb

## 2012-02-26 DIAGNOSIS — J471 Bronchiectasis with (acute) exacerbation: Secondary | ICD-10-CM | POA: Insufficient documentation

## 2012-02-26 DIAGNOSIS — R042 Hemoptysis: Secondary | ICD-10-CM

## 2012-02-26 MED ORDER — LEVOFLOXACIN 750 MG PO TABS: 750.0000 mg | ORAL_TABLET | Freq: Every day | ORAL | Status: DC

## 2012-02-26 NOTE — Addendum Note (Signed)
Addended by: Nita Sells on: 02/26/2012 12:22 PM   Modules accepted: Orders

## 2012-02-26 NOTE — Telephone Encounter (Signed)
I spoke with pt. She stated she coughed up several tbl spoons of blood last night. Since MW is not in. Pt is scheduled to come in today at 11:15 to see [facility]. Nothing further was needed

## 2012-02-26 NOTE — Assessment & Plan Note (Signed)
The patient is having increased secretions and hemoptysis that may be associated with a bronchiectasis flare.  I would like to start her on a course of antibiotics, and asked her to continue her aggressive pulmonary toilet.

## 2012-02-26 NOTE — Progress Notes (Signed)
  Subjective:    Patient ID: Jaime Fitzpatrick, female    DOB: [birth year], 72 y.o.   MRN: [id number]  HPI The patient comes in today for an acute sick visit.  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  For about the last one year she has had intermittent episodes of streaky hemoptysis, but last night had an episode that was much more significant.  This was associated with purulent mucus, and was a large quantity of blood.  She brought a sample in a plastic bag today that was impressive.  She has not had any fever or increased shortness of breath.  She has been getting percussive therapy every night by her husband with good results.  She has not had a recent CT of her chest.   Review of Systems  Constitutional: Negative for fever and unexpected weight change.  HENT: Negative for ear pain, nosebleeds, congestion, sore throat, rhinorrhea, sneezing, trouble swallowing, dental problem, postnasal drip and sinus pressure.   Eyes: Negative for redness and itching.  Respiratory: Positive for cough ( hemoptysis) and wheezing. Negative for chest tightness and shortness of breath.   Cardiovascular: Negative for palpitations and leg swelling.  Gastrointestinal: Negative for nausea and vomiting.  Genitourinary: Negative for dysuria.  Musculoskeletal: Negative for joint swelling.  Skin: Negative for rash.  Neurological: Negative for headaches.  Hematological: Does not bruise/bleed easily.  Psychiatric/Behavioral: Positive for agitation. Negative for dysphoric mood. The patient is not nervous/anxious.        Objective:   Physical Exam Thin female in no acute distress Nose without purulence or discharge noted Oropharynx clear Chest with a few scattered crackles, but no wheezing Cardiac exam with regular rate and rhythm Lower extremities without edema, no cyanosis Alert and oriented, moves all 4 extremities.       Assessment & Plan:

## 2012-02-26 NOTE — Patient Instructions (Addendum)
Will treat you with a course of levaquin 750mg  one a day for 10 days for possible flare of your bronchiectasis Will check a scan of your chest given the quantity of blood in mucus.  Will call you with results. Consider vibratory vest as a treatment.  See brochure.  Please call if worsening of your symptoms.

## 2012-02-26 NOTE — Assessment & Plan Note (Signed)
The patient had an episode of hemoptysis last night that was significant, and the most severe that she has had yet.  I think it would be worthwhile to check a CT of her chest to see if she may have an aspergilloma, or possibly increased findings to suggest MAC colonization/infection.

## 2012-02-28 ENCOUNTER — Encounter: Payer: Self-pay | Admitting: Internal Medicine

## 2012-02-28 DIAGNOSIS — B479 Mycetoma, unspecified: Secondary | ICD-10-CM | POA: Insufficient documentation

## 2012-03-18 ENCOUNTER — Encounter: Payer: Self-pay | Admitting: Internal Medicine

## 2012-03-18 ENCOUNTER — Ambulatory Visit (INDEPENDENT_AMBULATORY_CARE_PROVIDER_SITE_OTHER): Payer: Medicare Other | Admitting: Internal Medicine

## 2012-03-18 VITALS — BP 140/90 | HR 117 | Temp 97.0°F | Ht 69.0 in | Wt 141.4 lb

## 2012-03-18 DIAGNOSIS — J479 Bronchiectasis, uncomplicated: Secondary | ICD-10-CM

## 2012-03-18 NOTE — Progress Notes (Signed)
Subjective:     Patient ID: Jaime Fitzpatrick, female   DOB: 1940-07-07    MRN: [id number]  HPI  34 yowf never regular smoker with Bronchiectasis/ab after ARDS age 72 from Pneumoccal pna/sepsis.   July 21, 2009 ov doing well on unusual combo of advair 250 in am and 100 in pm.  rec no change rx   04/12/2011 f/u ov/[name] cc cough with  intermittently brown mucus esp ins am assoc with streaky hemoptysis for the past 6 months- started as blood streaks and 2 days ago produced approx 1 tbs RBR.  No abx x 4 months. On asa 81 mg daily. No sign sob or need for daytime saba rec Stop fish oil because of the risk of some of it ending up in your lung Hold aspirin whenever you see excessive amt of bleeding x 3 days off it.     03/18/2012 f/u ov/[name] cc daily x years coughing up to 4 tbsp of brown mucus every am with traces of brb only with one episode of gross hemoptyis 02/26/12  x 1/3 cup better after levaquin x 10 days.   No obvious daytime variabilty or assoc sobor cp or chest tightness, subjective wheeze overt sinus or hb symptoms. No unusual exp hx or h/o childhood pna/ asthma or premature birth to his knowledge.    At present Sleeping ok without nocturnal  or early am exacerbation  of respiratory  c/o's or need for noct saba. Also denies any obvious fluctuation of symptoms with weather or environmental changes or other aggravating or alleviating factors except as outlined above     ROS  The following are not active complaints unless bolded sore throat, dysphagia, dental problems, itching, sneezing,  nasal congestion or excess/ purulent secretions, ear ache,   fever, chills, sweats, unintended wt loss, pleuritic or exertional cp, hemoptysis,  orthopnea pnd or leg swelling, presyncope, palpitations, heartburn, abdominal pain, anorexia, nausea, vomiting, diarrhea  or change in bowel or urinary habits, change in stools or urine, dysuria,hematuria,  rash, arthralgias, visual complaints, headache, numbness  weakness or ataxia or problems with walking or coordination,  change in mood/affect or memory.        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  ETOH socially        Objective:   Physical Exam     Wt  141 July 21, 2009 >  137 01/09/2011 > 04/12/2011  141 > 03/18/2012  141 HEENT mild turbinate edema. Oropharynx no thrush or excess pnd or cobblestoning. No JVD or cervical adenopathy. Mild accessory muscle hypertrophy. Trachea midline, nl thryroid. Chest was hyperinflated by percussion with diminished breath sounds and moderate increased exp time without wheeze. Hoover sign positive at mid inspiration. Regular rate and rhythm without murmur gallop or rub or increase P2 or edema. Abd: no hsm, nl excursion. Ext warm without cyanosis or clubbing.   CT chest 02/26/12 1. Severe chronic upper lobe bronchiectasis worse on the left.  2. Small cavitary lesion with central nodule within left upper  lobe may represent mycetoma.  3. Reticular nodular pattern within the right middle lobe suggest  chronic infection such as MAI  4. Irregular nodule in the left lung base  Assessment:         Plan:

## 2012-03-18 NOTE — Patient Instructions (Addendum)
Please see patient coordinator before you leave today  to schedule delivery of vest to be used at least twice daily to see if it helps clear the mucus better  Please schedule a follow up visit in 3 months but call sooner if needed with a cxr

## 2012-03-19 NOTE — Assessment & Plan Note (Addendum)
Reviewed ct chest with pt and husband from 02/26/12 1. Severe chronic upper lobe bronchiectasis worse on the left.  2. Small cavitary lesion with central nodule within left upper  lobe may represent mycetoma.  3. Reticular nodular pattern within the right middle lobe suggest  chronic infection such as MAI  4. Irregular nodule in the left lung base  I had an extended discussion with the patient today lasting 15 to 20 minutes of a 25 minute visit on the following issues:  Her bronchiectasis is poorly controlled with freq need for abx and excessive daily purulent mucus and now may be complicated by early mycetoma  She is poor surgical candidate based on extensive scarring but could get embolization Rx if hemoptysis worsens.  In meantime needs trial of vest which she clearly qualifies for.   See instructions for specific recommendations which were reviewed directly with the patient who was given a copy with highlighter outlining the key components.   Late Add: This pt has tried and failied to control her daily symptoms of excess sputum retention by using cpt from her husband, a retired physician, and with use of bronchodilators and flutter valve and mucinex

## 2012-03-27 ENCOUNTER — Telehealth: Payer: Self-pay | Admitting: Internal Medicine

## 2012-03-27 NOTE — Telephone Encounter (Signed)
Per Huntley Dec at [employer], before [employer] will cover the VEST, they will need to see documentation that the pt has tried and failed therapies , such as, flutter valve or manual cpt. Huntley Dec states that the physician can append his notes to include this information. The new information will need to be faxed to [phone]. MW, pls advise.

## 2012-03-28 NOTE — Telephone Encounter (Signed)
Note 03/18/12 amended

## 2012-03-28 NOTE — Telephone Encounter (Signed)
Amended ov note faxed to Huntley Dec at [facility].

## 2012-03-28 NOTE — Telephone Encounter (Signed)
p 

## 2012-03-28 NOTE — Telephone Encounter (Signed)
Amended note faxed to Huntley Dec at [employer].

## 2012-04-14 ENCOUNTER — Other Ambulatory Visit: Payer: Self-pay | Admitting: Internal Medicine

## 2012-06-18 ENCOUNTER — Ambulatory Visit: Payer: Medicare Other | Admitting: Internal Medicine

## 2012-06-20 ENCOUNTER — Encounter: Payer: Self-pay | Admitting: Internal Medicine

## 2012-06-20 ENCOUNTER — Ambulatory Visit (INDEPENDENT_AMBULATORY_CARE_PROVIDER_SITE_OTHER)
Admission: RE | Admit: 2012-06-20 | Discharge: 2012-06-20 | Disposition: A | Discharge status: Home / Self Care | Payer: Medicare Other | Source: Ambulatory Visit | Attending: Internal Medicine | Admitting: Internal Medicine

## 2012-06-20 ENCOUNTER — Ambulatory Visit (INDEPENDENT_AMBULATORY_CARE_PROVIDER_SITE_OTHER): Payer: Medicare Other | Admitting: Internal Medicine

## 2012-06-20 VITALS — BP 130/84 | HR 87 | Temp 96.0°F

## 2012-06-20 DIAGNOSIS — J479 Bronchiectasis, uncomplicated: Secondary | ICD-10-CM

## 2012-06-20 DIAGNOSIS — R042 Hemoptysis: Secondary | ICD-10-CM

## 2012-06-20 NOTE — Patient Instructions (Addendum)
Continue levaquin x 5 days for change in usual mucus   Please schedule a follow up visit in 3 months but call sooner if needed with cxr

## 2012-06-20 NOTE — Progress Notes (Signed)
Subjective:     Patient ID: Jaime Fitzpatrick, female   DOB: 11-Apr-1940    MRN: [id number]  HPI  72 yowf never regular smoker with Bronchiectasis/ab after ARDS age 72 from Pneumoccal pna/sepsis.   July 21, 2009 ov doing well on unusual combo of advair 250 in am and 100 in pm.  rec no change rx   04/12/2011 f/u ov/Saxon Barich cc cough with  intermittently brown mucus esp ins am assoc with streaky hemoptysis for the past 6 months- started as blood streaks and 2 days ago produced approx 1 tbs RBR.  No abx x 4 months. On asa 81 mg daily. No sign sob or need for daytime saba rec Stop fish oil because of the risk of some of it ending up in your lung Hold aspirin whenever you see excessive amt of bleeding x 3 days off it.     03/18/2012 f/u ov/Brandn Mcgath cc daily x years coughing up to 4 tbsp of brown mucus every am with traces of brb only with one episode of gross hemoptyis 02/26/12  x 1/3 cup better after levaquin x 10 days.  rec Start vest   06/20/2012 f/u ov/Marc Sivertsen re bronchiectasis maintainted on advair  Chief Complaint  Patient presents with  . Follow-up    Pt states breathing is doing well and her cough is about the same since her last visit.   traces of threads of blood in ams no worse with advil, not sure vest helping has not used levaquin cycle since previous ov.  Most of her mucus is only slt discolored, better effect with CPPD per husband than with vest.   No obvious daytime variabilty or assoc sob or cp or chest tightness, subjective wheeze overt sinus or hb symptoms. No unusual exp hx or h/o childhood pna/ asthma or premature birth to his knowledge.    At present Sleeping ok without nocturnal  or early am exacerbation  of respiratory  c/o's or need for noct saba. Also denies any obvious fluctuation of symptoms with weather or environmental changes or other aggravating or alleviating factors except as outlined above     Current Medications, Allergies, Past Medical History, Past Surgical History,  Family History, and Social History were reviewed in [employer] record.  ROS  The following are not active complaints unless bolded sore throat, dysphagia, dental problems, itching, sneezing,  nasal congestion or excess/ purulent secretions, ear ache,   fever, chills, sweats, unintended wt loss, pleuritic or exertional cp, hemoptysis,  orthopnea pnd or leg swelling, presyncope, palpitations, heartburn, abdominal pain, anorexia, nausea, vomiting, diarrhea  or change in bowel or urinary habits, change in stools or urine, dysuria,hematuria,  rash, arthralgias, visual complaints, headache, numbness weakness or ataxia or problems with walking or coordination,  change in mood/affect or memory.            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  ETOH socially        Objective:   Physical Exam  Baseline wt around 140 amb pleasant wf nad with nl vital signes  HEENT mild turbinate edema. Oropharynx no thrush or excess pnd or cobblestoning. No JVD or cervical adenopathy. Mild accessory muscle hypertrophy. Trachea midline, nl thryroid. Chest  was min hyperinflated by percussion with diminished breath sounds and moderate increased exp time with min rhonchi on exp L > R . Hoover sign positive at end inspiration. Regular rate and rhythm without murmur gallop or rub or increase P2 or edema. Abd: no hsm, nl excursion. Ext warm without cyanosis or clubbing.   CT chest 02/26/12 1. Severe chronic upper lobe bronchiectasis worse on the left.  2. Small cavitary lesion with central nodule within left upper  lobe may represent  mycetoma.  3. Reticular nodular pattern within the right middle lobe suggest  chronic infection such as MAI  4. Irregular nodule in the left lung base   CXR  06/20/2012 :  Biapical pleural parenchymal scarring, left greater than right, with scattered pulmonary parenchymal scarring and bronchiectasis    Assessment:         Plan:

## 2012-06-22 NOTE — Assessment & Plan Note (Addendum)
-   CT chest 02/26/12 1. Severe chronic upper lobe bronchiectasis worse on the left.  2. Small cavitary lesion with central nodule within left upper  lobe may represent mycetoma.  3. Reticular nodular pattern within the right middle lobe suggest  chronic infection such as MAI  4. Irregular nodule in the left lung base - Vest added 03/28/12   Adequate control on present rx, reviewed contingencies, for now will continue vest.    Each maintenance medication was reviewed in detail including most importantly the difference between maintenance and as needed and under what circumstances the prns are to be used.  Please see instructions for details which were reviewed in writing and the patient given a copy.

## 2012-06-22 NOTE — Assessment & Plan Note (Signed)
Minimal problem at present but concerned re very small ? Mycetoma in LUL > poor op candidate so best choice would probably be to attempt embolization if really flared and for now just treat the underlying dz (bronchiectasis)

## 2012-09-17 ENCOUNTER — Other Ambulatory Visit: Payer: Self-pay | Admitting: Internal Medicine

## 2012-09-17 DIAGNOSIS — J479 Bronchiectasis, uncomplicated: Secondary | ICD-10-CM

## 2012-09-18 ENCOUNTER — Encounter: Payer: Self-pay | Admitting: Internal Medicine

## 2012-09-18 ENCOUNTER — Ambulatory Visit (INDEPENDENT_AMBULATORY_CARE_PROVIDER_SITE_OTHER): Payer: Medicare Other | Admitting: Internal Medicine

## 2012-09-18 ENCOUNTER — Ambulatory Visit (INDEPENDENT_AMBULATORY_CARE_PROVIDER_SITE_OTHER)
Admission: RE | Admit: 2012-09-18 | Discharge: 2012-09-18 | Disposition: A | Discharge status: Home / Self Care | Payer: Medicare Other | Source: Ambulatory Visit | Attending: Internal Medicine | Admitting: Internal Medicine

## 2012-09-18 VITALS — BP 102/68 | HR 103 | Temp 97.3°F | Ht 68.5 in | Wt 135.0 lb

## 2012-09-18 DIAGNOSIS — J471 Bronchiectasis with (acute) exacerbation: Secondary | ICD-10-CM

## 2012-09-18 DIAGNOSIS — J479 Bronchiectasis, uncomplicated: Secondary | ICD-10-CM

## 2012-09-18 DIAGNOSIS — A429 Actinomycosis, unspecified: Secondary | ICD-10-CM

## 2012-09-18 DIAGNOSIS — B479 Mycetoma, unspecified: Secondary | ICD-10-CM

## 2012-09-18 NOTE — Patient Instructions (Signed)
Continue levaquin 750 x 5 days for really nasty mucus  Consider Prevnar next visit Dr Felipa Eth

## 2012-09-18 NOTE — Progress Notes (Signed)
Subjective:     Patient ID: Jaime Fitzpatrick, female   DOB: Oct 14, 1940    MRN: [id number]    Brief patient profile:  9 yowf never regular smoker with Bronchiectasis/ab after ARDS age 72 from Pneumoccal pna/sepsis.     HPI 04/12/2011 f/u ov/Jaime Fitzpatrick cc cough with  intermittently brown mucus esp ins am assoc with streaky hemoptysis for the past 6 months- started as blood streaks and 2 days ago produced approx 1 tbs RBR.  No abx x 4 months. On asa 81 mg daily. No sign sob or need for daytime saba rec Stop fish oil because of the risk of some of it ending up in your lung Hold aspirin whenever you see excessive amt of bleeding x 3 days off it.     03/18/2012 f/u ov/Jaime Fitzpatrick cc daily x years coughing up to 4 tbsp of brown mucus every am with traces of brb only with one episode of gross hemoptyis 02/26/12  x 1/3 cup better after levaquin x 10 days.  rec Start vest   06/20/2012 f/u ov/Jaime Fitzpatrick re bronchiectasis maintainted on advair  Chief Complaint  Patient presents with  . Follow-up    Pt states breathing is doing well and her cough is about the same since her last visit.   traces of threads of blood in ams no worse with advil, not sure vest helping has not used levaquin cycle since previous ov.  Most of her mucus is only slt discolored, better effect with CPPD per husband than with vest.  rec Continue levaquin x 5 days for change in usual mucus   09/18/2012 f/u ov/Jaime Fitzpatrick re ? Mycetoma LUL Chief Complaint  Patient presents with  . Follow-up    No changes. No complaints.    coughed up blood x 5 days in June while on vicodin, minimal p rx levaquin, no limiting sob     No obvious daytime variabilty or assoc sob or cp or chest tightness, subjective wheeze overt sinus or hb symptoms. No unusual exp hx or h/o childhood pna/ asthma or premature birth to his knowledge.    At present Sleeping ok without nocturnal  or early am exacerbation  of respiratory  c/o's or need for noct saba. Also denies any obvious  fluctuation of symptoms with weather or environmental changes or other aggravating or alleviating factors except as outlined above     Current Medications, Allergies, Past Medical History, Past Surgical History, Family History, and Social History were reviewed in [employer] record.  ROS  The following are not active complaints unless bolded sore throat, dysphagia, dental problems, itching, sneezing,  nasal congestion or excess/ purulent secretions, ear ache,   fever, chills, sweats, unintended wt loss, pleuritic or exertional cp, hemoptysis,  orthopnea pnd or leg swelling, presyncope, palpitations, heartburn, abdominal pain, anorexia, nausea, vomiting, diarrhea  or change in bowel or urinary habits, change in stools or urine, dysuria,hematuria,  rash, arthralgias, visual complaints, headache, numbness weakness or ataxia or problems with walking or coordination,  change in mood/affect or memory.            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  ETOH socially        Objective:  Physical Exam  Baseline wt around 140  Wt Readings from Last 3 Encounters:  09/18/12 135 lb (61.236 kg)  03/18/12 141 lb 6.4 oz (64.139 kg)  02/26/12 139 lb 3.2 oz (63.141 kg)    amb pleasant wf nad with nl vital signs HEENT mild turbinate edema. Oropharynx no thrush or excess pnd or cobblestoning. No JVD or cervical adenopathy. Mild accessory muscle hypertrophy. Trachea midline, nl thryroid. Chest was min hyperinflated by percussion with diminished breath sounds and moderate increased exp time with min rhonchi on exp L > R .  Hoover sign positive at end inspiration. Regular rate and rhythm without murmur gallop or rub or increase P2 or edema. Abd: no hsm, nl excursion. Ext warm without cyanosis or clubbing.     CT chest 02/26/12 1. Severe chronic upper lobe bronchiectasis worse on the left.  2. Small cavitary lesion with central nodule within left upper  lobe may represent mycetoma.  3. Reticular nodular pattern within the right middle lobe suggest  chronic infection such as MAI  4. Irregular nodule in the left lung base  CXR  09/18/2012 : 1. No change chronic pulmonary scarring at the bases and apices.  2. Left basilar bronchiectasis and left upper lobe bronchiectasis  and pleural thickening are unchanged.     Assessment:

## 2012-09-19 NOTE — Assessment & Plan Note (Signed)
Discussed limited options for hemoptysis with or without mycetoma first step would likely be to attempt coil per IR to interupt LUL bronchial blood supply

## 2012-09-19 NOTE — Assessment & Plan Note (Addendum)
-   CT chest 02/26/12 1. Severe chronic upper lobe bronchiectasis worse on the left.  2. Small cavitary lesion with central nodule within left upper  lobe may represent mycetoma.  3. Reticular nodular pattern within the right middle lobe suggest  chronic infection such as MAI  4. Irregular nodule in the left lung base - Vest added 03/28/12   would probably go ahead and give her Prevnar sp pneumoccal vaccination in 2010 > defer to Dr Felipa Eth as we have no prevnar here and cautioned this expense not covered by medicare  No evidence of expanding mycetoma > no change rx needed     Each maintenance medication was reviewed in detail including most importantly the difference between maintenance and as needed and under what circumstances the prns are to be used.  Please see instructions for details which were reviewed in writing and the patient given a copy.

## 2013-02-17 ENCOUNTER — Telehealth: Payer: Self-pay | Admitting: Internal Medicine

## 2013-02-17 NOTE — Telephone Encounter (Signed)
I called and spoke with Jaime Fitzpatrick. She reports Dr. Theda Sers from [facility] ortho is faxing over MRI of shoulder per Jaime Fitzpatrick this afternoon. She was called about 1 hr ago about results. Was told abnormality in her lymph node and also in the left lobe. She is calling as an FYI to let us know this is coming through. Will forward to MW as an Pharmacist, hospital

## 2013-05-04 ENCOUNTER — Other Ambulatory Visit: Payer: Self-pay | Admitting: Internal Medicine

## 2014-02-04 ENCOUNTER — Other Ambulatory Visit: Payer: Self-pay | Admitting: Dermatology

## 2014-06-09 ENCOUNTER — Other Ambulatory Visit: Payer: Self-pay | Admitting: Gastroenterology

## 2014-07-14 ENCOUNTER — Ambulatory Visit: Payer: Self-pay | Admitting: Surgery

## 2014-07-16 ENCOUNTER — Ambulatory Visit: Payer: Self-pay | Admitting: Internal Medicine

## 2014-07-16 ENCOUNTER — Other Ambulatory Visit: Payer: Self-pay | Admitting: Internal Medicine

## 2014-07-16 DIAGNOSIS — J479 Bronchiectasis, uncomplicated: Secondary | ICD-10-CM

## 2014-07-20 ENCOUNTER — Encounter: Payer: Self-pay | Admitting: Internal Medicine

## 2014-07-20 ENCOUNTER — Ambulatory Visit (INDEPENDENT_AMBULATORY_CARE_PROVIDER_SITE_OTHER): Payer: Medicare Other | Admitting: Internal Medicine

## 2014-07-20 ENCOUNTER — Ambulatory Visit (INDEPENDENT_AMBULATORY_CARE_PROVIDER_SITE_OTHER)
Admission: RE | Admit: 2014-07-20 | Discharge: 2014-07-20 | Disposition: A | Discharge status: Home / Self Care | Payer: Medicare Other | Source: Ambulatory Visit | Attending: Internal Medicine | Admitting: Internal Medicine

## 2014-07-20 DIAGNOSIS — J479 Bronchiectasis, uncomplicated: Secondary | ICD-10-CM | POA: Diagnosis not present

## 2014-07-20 MED ORDER — BUDESONIDE-FORMOTEROL FUMARATE 80-4.5 MCG/ACT IN AERO: INHALATION_SPRAY | RESPIRATORY_TRACT | Status: DC

## 2014-07-20 NOTE — Patient Instructions (Addendum)
Do not use macrobid/ macrodantin/ nitrofurantoin   Stop advair  symbicort 80 Take 2 puffs first thing in am and then another 2 puffs about 12 hours later.   Work on Engineer, technical sales technique:  relax and gently blow all the way out then take a nice smooth deep breath back in, triggering the inhaler at same time you start breathing in.  Hold for up to 5 seconds if you can.  Rinse and gargle with water when done   Please remember to go to thex-ray department downstairs for your tests - we will call you with the results when they are available  Please schedule a follow up office visit in 6 weeks, call sooner if needed with pfts on return

## 2014-07-20 NOTE — Progress Notes (Signed)
Subjective:     Patient ID: Jaime Fitzpatrick, female   DOB: 08-Dec-1940    MRN: [id number]    Brief patient profile:  7 yowf never regular smoker with Bronchiectasis/ab after ARDS age 74 from Neche pna/sepsis.     HPI 04/12/2011 f/u ov/Jaime Fitzpatrick cc cough with  intermittently brown mucus esp ins am assoc with streaky hemoptysis for the past 6 months- started as blood streaks and 2 days ago produced approx 1 tbs RBR.  No abx x 4 months. On asa 81 mg daily. No sign sob or need for daytime saba rec Stop fish oil because of the risk of some of it ending up in your lung Hold aspirin whenever you see excessive amt of bleeding x 3 days off it.     03/18/2012 f/u ov/Jaime Fitzpatrick cc daily x years coughing up to 4 tbsp of brown mucus every am with traces of brb only with one episode of gross hemoptyis 02/26/12  x 1/3 cup better after levaquin x 10 days.  rec Start vest   06/20/2012 f/u ov/Jaime Fitzpatrick re bronchiectasis maintainted on advair  Chief Complaint  Patient presents with  . Follow-up    Pt states breathing is doing well and her cough is about the same since her last visit.   traces of threads of blood in ams no worse with advil, not sure vest helping has not used levaquin cycle since previous ov.  Most of her mucus is only slt discolored, better effect with CPPD per husband than with vest.  rec Continue levaquin x 5 days for change in usual mucus   09/18/2012 f/u ov/Jaime Fitzpatrick re ? Mycetoma LUL Chief Complaint  Patient presents with  . Follow-up    No changes. No complaints.   coughed up blood x 5 days in June while on vicodin, minimal p rx levaquin, no limiting sob   rec Continue levaquin 750 x 5 days for really nasty mucus   07/20/2014 f/u ov/Jaime Fitzpatrick re: bronchiectasis   Chief Complaint  Patient presents with  . Follow-up    Pt c/o increased SOB, fatigue, prod cough with yellow mucus. Denies any chest tightness/congestion.   on advair 250 one bid  [place] since 1 year/ was walking 3 miles and now  just one mile s stopping some inclines   Cough worse thing in am but rarely bloody now/ only using levaquin for purulent sputum once or twice a year     No obvious daytime variabilty or assoc   cp or chest tightness, subjective wheeze overt sinus or hb symptoms. No unusual exp hx or h/o childhood pna/ asthma or premature birth to his knowledge.    At present Sleeping ok without nocturnal  or early am exacerbation  of respiratory  c/o's or need for noct saba. Also denies any obvious fluctuation of symptoms with weather or environmental changes or other aggravating or alleviating factors except as outlined above     Current Medications, Allergies, Past Medical History, Past Surgical History, Family History, and Social History were reviewed in [employer] record.  ROS  The following are not active complaints unless bolded sore throat, dysphagia, dental problems, itching, sneezing,  nasal congestion or excess/ purulent secretions, ear ache,   fever, chills, sweats, unintended wt loss, pleuritic or exertional cp, hemoptysis,  orthopnea pnd or leg swelling, presyncope, palpitations, heartburn, abdominal pain, anorexia, nausea, vomiting, diarrhea  or change in bowel or urinary habits, change in stools or urine, dysuria,hematuria,  rash, arthralgias, visual complaints, headache,  numbness weakness or ataxia or problems with walking or coordination,  change in mood/affect or memory.            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 Smoked approx 1 yr socailly in 1968.  ETOH socially        Objective:   Physical Exam  Baseline wt around  140  07/20/2014        137  Wt Readings from Last 3 Encounters:  09/18/12 135 lb (61.236 kg)  03/18/12 141 lb 6.4 oz (64.139 kg)  02/26/12 139 lb 3.2 oz (63.141 kg)    amb pleasant wf nad with nl vital signs HEENT mild turbinate edema. Oropharynx no thrush or excess pnd or cobblestoning. No JVD or cervical adenopathy. Mild accessory muscle hypertrophy. Trachea midline, nl thryroid. Chest was min hyperinflated by percussion with diminished breath sounds and moderate increased exp time with min rhonchi on exp L > R . Hoover sign positive at end inspiration. Regular rate and rhythm without murmur gallop or rub or increase P2 or edema. Abd: no hsm, nl excursion. Ext warm without cyanosis or clubbing.      I personally reviewed images and agree with radiology impression as follows:  CXR:  07/20/14  Cardiac shadow is stable. Scarring is again seen in the apices bilaterally. Diffuse interstitial changes are noted and stable. Bilateral breast implants are seen. No acute bony abnormality is noted.         Assessment:

## 2014-07-21 ENCOUNTER — Encounter: Payer: Self-pay | Admitting: Internal Medicine

## 2014-07-21 ENCOUNTER — Telehealth: Payer: Self-pay | Admitting: *Deleted

## 2014-07-21 NOTE — Telephone Encounter (Signed)
12/13/2013 was last time she had her prevnar  [phone]

## 2014-07-21 NOTE — Telephone Encounter (Signed)
-----   Message from Tanda Rockers, MD sent at 07/21/2014  9:13 AM EDT ----- Make sure she's had prevnar from primary and roughly the date to enter under immunization

## 2014-07-21 NOTE — Assessment & Plan Note (Addendum)
-   CT chest 02/26/12 1. Severe chronic upper lobe bronchiectasis worse on the left.  2. Small cavitary lesion with central nodule within left upper  lobe may represent mycetoma.  3. Reticular nodular pattern within the right middle lobe suggest  chronic infection such as MAI  4. Irregular nodule in the left lung base - Vest added 03/28/12   The proper method of use, as well as anticipated side effects, of a metered-dose inhaler are discussed and demonstrated to the patient. Improved effectiveness after extensive coaching during this visit to a level of approximately  75%   I had an extended discussion with the patient reviewing all relevant studies completed to date and  lasting 15 to 20 minutes of a 25 minute visit on the following ongoing concerns:'  I had an extended discussion with the patient reviewing all relevant studies completed to date and  lasting 15 to 20 minutes of a 25 minute visit on the following ongoing concerns:  1) worsening sob on advair suggests not addressing the airflow obst assoc with bronchiectasis may be due to poor depositon of the drug distally or adverse effect of the drug proximally on the upper airway or both  2) trial of symbicort worthwhile > 80 2bid  3) need to be sure she has had prevnar  4) f/u with pfts needed and consider moving up to the 160 on return if symptoms not improved

## 2014-07-21 NOTE — Telephone Encounter (Signed)
Spoke with the pt  She has had prevnar, but unsure of when  She has to call her PCP today anyway, and will inquire about prevnar and then call me back

## 2014-07-21 NOTE — Telephone Encounter (Signed)
Noted.   Documented in chart. Nothing further needed.

## 2014-08-03 ENCOUNTER — Telehealth: Payer: Self-pay | Admitting: Internal Medicine

## 2014-08-03 ENCOUNTER — Encounter (HOSPITAL_BASED_OUTPATIENT_CLINIC_OR_DEPARTMENT_OTHER)
Admission: RE | Admit: 2014-08-03 | Discharge: 2014-08-03 | Disposition: A | Discharge status: Home / Self Care | Payer: Medicare Other | Source: Ambulatory Visit | Attending: Surgery | Admitting: Surgery

## 2014-08-03 ENCOUNTER — Encounter (HOSPITAL_BASED_OUTPATIENT_CLINIC_OR_DEPARTMENT_OTHER): Payer: Self-pay | Admitting: *Deleted

## 2014-08-03 DIAGNOSIS — I4510 Unspecified right bundle-branch block: Secondary | ICD-10-CM | POA: Diagnosis not present

## 2014-08-03 DIAGNOSIS — M799 Soft tissue disorder, unspecified: Secondary | ICD-10-CM | POA: Diagnosis present

## 2014-08-03 DIAGNOSIS — Z8249 Family history of ischemic heart disease and other diseases of the circulatory system: Secondary | ICD-10-CM | POA: Diagnosis not present

## 2014-08-03 DIAGNOSIS — J479 Bronchiectasis, uncomplicated: Secondary | ICD-10-CM

## 2014-08-03 DIAGNOSIS — I493 Ventricular premature depolarization: Secondary | ICD-10-CM | POA: Diagnosis not present

## 2014-08-03 DIAGNOSIS — D2121 Benign neoplasm of connective and other soft tissue of right lower limb, including hip: Secondary | ICD-10-CM | POA: Diagnosis not present

## 2014-08-03 DIAGNOSIS — Z888 Allergy status to other drugs, medicaments and biological substances status: Secondary | ICD-10-CM | POA: Diagnosis not present

## 2014-08-03 DIAGNOSIS — J45909 Unspecified asthma, uncomplicated: Secondary | ICD-10-CM | POA: Diagnosis not present

## 2014-08-03 DIAGNOSIS — Z811 Family history of alcohol abuse and dependence: Secondary | ICD-10-CM | POA: Diagnosis not present

## 2014-08-03 DIAGNOSIS — Z886 Allergy status to analgesic agent status: Secondary | ICD-10-CM | POA: Diagnosis not present

## 2014-08-03 DIAGNOSIS — Z86010 Personal history of colonic polyps: Secondary | ICD-10-CM | POA: Diagnosis not present

## 2014-08-03 DIAGNOSIS — I517 Cardiomegaly: Secondary | ICD-10-CM | POA: Diagnosis not present

## 2014-08-03 DIAGNOSIS — Z880 Allergy status to penicillin: Secondary | ICD-10-CM | POA: Diagnosis not present

## 2014-08-03 MED ORDER — BUDESONIDE-FORMOTEROL FUMARATE 80-4.5 MCG/ACT IN AERO: INHALATION_SPRAY | RESPIRATORY_TRACT | Status: DC

## 2014-08-03 NOTE — Progress Notes (Signed)
Patient has long standing hx of ARDS and pneumonia. She sees Dr Melvyn Novas for same. States she feels more SOB and was recently changed to Symbicort inhaler with good results. She had BMET done at Dr Danna Hefty office 07-01-14 and those results were requested. She will come in for EKG and anesthesia consult for lung hx.

## 2014-08-03 NOTE — Telephone Encounter (Signed)
Called and spoke to pt. Pt stated she lost her paper script for Symbicort and needs a new one sent to [place]. Rx sent to preferred pharmacy. Pt verbalized understanding and denied any further questions or concerns at this time.

## 2014-08-04 ENCOUNTER — Encounter (HOSPITAL_BASED_OUTPATIENT_CLINIC_OR_DEPARTMENT_OTHER): Payer: Self-pay | Admitting: Surgery

## 2014-08-04 DIAGNOSIS — M7989 Other specified soft tissue disorders: Secondary | ICD-10-CM | POA: Diagnosis present

## 2014-08-04 NOTE — H&P (Signed)
General Surgery [facility]  [place] DOB: [birth year] Undefined / Language: Undefined / Race: Undefined Female  History of Present Illness The patient is a 74 year old female who presents with a soft tissue mass. Patient is self-referred. Patient has a soft tissue mass on the posterior right hip along the iliac crest which measures approximately 2 cm in size and has been present for a number of years. It has recently become more firm and is causing some discomfort, especially when lying in a recumbent position. This may be related to a previous injection at that site during an illness 23 years ago. Patient now desires surgical excision for definitive diagnosis and for management of increasing discomfort and for changes in the character of the lesion.   Other Problems Asthma Heart murmur Transfusion history  Past Surgical History Colon Polyp Removal - Colonoscopy  Diagnostic Studies History Colonoscopy within last year Mammogram within last year Pap Smear 1-5 years ago  Social History Alcohol use Occasional alcohol use. No caffeine use No drug use Tobacco use Never smoker.  Family History Alcohol Abuse Mother. Heart Disease Father.  Pregnancy / Birth History Age at menarche 71 years. Age of menopause 58-50 Gravida 3 Maternal age 53-25 Para 3  Review of Systems General Not Present- Appetite Loss, Chills, Fatigue, Fever, Night Sweats, Weight Gain and Weight Loss. Skin Not Present- Change in Wart/Mole, Dryness, Hives, Jaundice, New Lesions, Non-Healing Wounds, Rash and Ulcer. HEENT Not Present- Earache, Hearing Loss, Hoarseness, Nose Bleed, Oral Ulcers, Ringing in the Ears, Seasonal Allergies, Sinus Pain, Sore Throat, Visual Disturbances, Wears glasses/contact lenses and Yellow Eyes. Respiratory Present- Chronic Cough. Not Present- Bloody sputum, Difficulty Breathing, Snoring and Wheezing. Breast Not Present- Breast Mass,  Breast Pain, Nipple Discharge and Skin Changes. Cardiovascular Present- Shortness of Breath. Not Present- Chest Pain, Difficulty Breathing Lying Down, Leg Cramps, Palpitations, Rapid Heart Rate and Swelling of Extremities. Gastrointestinal Not Present- Abdominal Pain, Bloating, Bloody Stool, Change in Bowel Habits, Chronic diarrhea, Constipation, Difficulty Swallowing, Excessive gas, Gets full quickly at meals, Hemorrhoids, Indigestion, Nausea, Rectal Pain and Vomiting. Female Genitourinary Not Present- Frequency, Nocturia, Painful Urination, Pelvic Pain and Urgency. Musculoskeletal Present- Joint Pain. Not Present- Back Pain, Joint Stiffness, Muscle Pain, Muscle Weakness and Swelling of Extremities. Neurological Not Present- Decreased Memory, Fainting, Headaches, Numbness, Seizures, Tingling, Tremor, Trouble walking and Weakness. Psychiatric Not Present- Anxiety, Bipolar, Change in Sleep Pattern, Depression, Fearful and Frequent crying. Endocrine Not Present- Cold Intolerance, Excessive Hunger, Hair Changes, Heat Intolerance, Hot flashes and New Diabetes. Hematology Not Present- Easy Bruising, Excessive bleeding, Gland problems, HIV and Persistent Infections.   Physical Exam  General - appears comfortable, no distress; not diaphorectic  HEENT - normocephalic; sclerae clear, gaze conjugate; mucous membranes moist, dentition good; voice normal  Neck - symmetric on extension; no palpable anterior or posterior cervical adenopathy; no palpable masses in the thyroid bed  Chest - coarse bilaterally with scattered rhonchi  Cor - regular rhythm with normal rate; no significant murmur  Back - approximately 2 cm shaped firm mass right posterior hip, somewhat fixed, mildly tender  Ext - non-tender without significant edema or lymphedema  Neuro - grossly intact; no tremor    Assessment & Plan  SOFT TISSUE MASS (729.90  M79.9)  Patient has a soft tissue mass in the right posterior hip area  measuring approximately 2 cm in size. This may be related to an old injection site. Nevertheless, he had has become slightly more firm and more uncomfortable to  the patient, especially when in a recumbent position. She desires surgical excision.  Patient has been discussed with anesthesia, Dr. Lorrene Reid, due to her previous pulmonary injury and pulmonary limitations. We will plan for surgical excision under sedation and local anesthesia as an outpatient surgical procedure. Patient and her husband understand and wish to proceed.  The risks and benefits of the procedure have been discussed at length with the patient. The patient understands the proposed procedure, potential alternative treatments, and the course of recovery to be expected. All of the patient's questions have been answered at this time. The patient wishes to proceed with surgery.  Earnstine Regal, MD, [facility] Office: [phone]

## 2014-08-05 ENCOUNTER — Ambulatory Visit (HOSPITAL_BASED_OUTPATIENT_CLINIC_OR_DEPARTMENT_OTHER)
Admission: RE | Admit: 2014-08-05 | Discharge: 2014-08-05 | Disposition: A | Discharge status: Home / Self Care | Payer: Medicare Other | Source: Ambulatory Visit | Attending: Surgery | Admitting: Surgery

## 2014-08-05 ENCOUNTER — Encounter (HOSPITAL_BASED_OUTPATIENT_CLINIC_OR_DEPARTMENT_OTHER)
Admission: RE | Disposition: A | Discharge status: Home / Self Care | Payer: Self-pay | Source: Ambulatory Visit | Attending: Surgery

## 2014-08-05 ENCOUNTER — Ambulatory Visit (HOSPITAL_BASED_OUTPATIENT_CLINIC_OR_DEPARTMENT_OTHER): Payer: Medicare Other | Admitting: Anesthesiology

## 2014-08-05 ENCOUNTER — Encounter (HOSPITAL_BASED_OUTPATIENT_CLINIC_OR_DEPARTMENT_OTHER): Payer: Self-pay

## 2014-08-05 DIAGNOSIS — M7989 Other specified soft tissue disorders: Secondary | ICD-10-CM | POA: Diagnosis present

## 2014-08-05 DIAGNOSIS — I4510 Unspecified right bundle-branch block: Secondary | ICD-10-CM | POA: Insufficient documentation

## 2014-08-05 DIAGNOSIS — Z86010 Personal history of colonic polyps: Secondary | ICD-10-CM | POA: Insufficient documentation

## 2014-08-05 DIAGNOSIS — I493 Ventricular premature depolarization: Secondary | ICD-10-CM | POA: Insufficient documentation

## 2014-08-05 DIAGNOSIS — Z888 Allergy status to other drugs, medicaments and biological substances status: Secondary | ICD-10-CM | POA: Insufficient documentation

## 2014-08-05 DIAGNOSIS — I517 Cardiomegaly: Secondary | ICD-10-CM | POA: Insufficient documentation

## 2014-08-05 DIAGNOSIS — J45909 Unspecified asthma, uncomplicated: Secondary | ICD-10-CM | POA: Insufficient documentation

## 2014-08-05 DIAGNOSIS — Z8249 Family history of ischemic heart disease and other diseases of the circulatory system: Secondary | ICD-10-CM | POA: Insufficient documentation

## 2014-08-05 DIAGNOSIS — D2121 Benign neoplasm of connective and other soft tissue of right lower limb, including hip: Secondary | ICD-10-CM | POA: Insufficient documentation

## 2014-08-05 DIAGNOSIS — Z811 Family history of alcohol abuse and dependence: Secondary | ICD-10-CM | POA: Insufficient documentation

## 2014-08-05 DIAGNOSIS — Z886 Allergy status to analgesic agent status: Secondary | ICD-10-CM | POA: Insufficient documentation

## 2014-08-05 DIAGNOSIS — Z880 Allergy status to penicillin: Secondary | ICD-10-CM | POA: Insufficient documentation

## 2014-08-05 HISTORY — PX: MASS EXCISION: SHX2000

## 2014-08-05 HISTORY — DX: Reserved for inherently not codable concepts without codable children: IMO0001

## 2014-08-05 LAB — POCT HEMOGLOBIN-HEMACUE: Hemoglobin: 13.2 g/dL (ref 12.0–15.0)

## 2014-08-05 SURGERY — EXCISION MASS
Anesthesia: Monitor Anesthesia Care | Site: Hip | Laterality: Right

## 2014-08-05 MED ORDER — FENTANYL CITRATE (PF) 100 MCG/2ML IJ SOLN
50.0000 ug | INTRAMUSCULAR | Status: DC | PRN
  Administered 2014-08-05: 100 ug via INTRAVENOUS

## 2014-08-05 MED ORDER — FENTANYL CITRATE (PF) 100 MCG/2ML IJ SOLN
50.0000 ug | INTRAMUSCULAR | Status: DC | PRN
Start: 2014-08-05 — End: 2014-09-10

## 2014-08-05 MED ORDER — HYDROCODONE-ACETAMINOPHEN 5-325 MG PO TABS: 1.0000 | ORAL_TABLET | ORAL | Status: DC | PRN

## 2014-08-05 MED ORDER — BUPIVACAINE HCL (PF) 0.5 % IJ SOLN
INTRAMUSCULAR | Status: AC
  Filled 2014-08-05: qty 30

## 2014-08-05 MED ORDER — VANCOMYCIN HCL IN DEXTROSE 1-5 GM/200ML-% IV SOLN
1000.0000 mg | INTRAVENOUS | Status: AC
  Administered 2014-08-05 (×2): 1000 mg via INTRAVENOUS

## 2014-08-05 MED ORDER — FENTANYL CITRATE (PF) 100 MCG/2ML IJ SOLN: 25.0000 ug | INTRAMUSCULAR | Status: DC | PRN

## 2014-08-05 MED ORDER — LACTATED RINGERS IV SOLN
INTRAVENOUS | Status: DC
  Administered 2014-08-05: 07:00:00 via INTRAVENOUS

## 2014-08-05 MED ORDER — LIDOCAINE HCL (PF) 1 % IJ SOLN
INTRAMUSCULAR | Status: AC
  Filled 2014-08-05: qty 30

## 2014-08-05 MED ORDER — SCOPOLAMINE 1 MG/3DAYS TD PT72: 1.0000 | MEDICATED_PATCH | Freq: Once | TRANSDERMAL | Status: DC | PRN

## 2014-08-05 MED ORDER — VANCOMYCIN HCL IN DEXTROSE 1-5 GM/200ML-% IV SOLN
INTRAVENOUS | Status: AC
  Filled 2014-08-05: qty 200

## 2014-08-05 MED ORDER — BUPIVACAINE-EPINEPHRINE 0.5% -1:200000 IJ SOLN
INTRAMUSCULAR | Status: DC | PRN
Start: 2014-08-05 — End: 2014-08-05
  Administered 2014-08-05: 15 mL

## 2014-08-05 MED ORDER — ONDANSETRON HCL 4 MG/2ML IJ SOLN
INTRAMUSCULAR | Status: DC | PRN
  Administered 2014-08-05: 4 mg via INTRAVENOUS

## 2014-08-05 MED ORDER — MIDAZOLAM HCL 2 MG/2ML IJ SOLN
1.0000 mg | INTRAMUSCULAR | Status: DC | PRN
  Administered 2014-08-05: 2 mg via INTRAVENOUS

## 2014-08-05 MED ORDER — FENTANYL CITRATE (PF) 100 MCG/2ML IJ SOLN
INTRAMUSCULAR | Status: AC
  Filled 2014-08-05: qty 4

## 2014-08-05 MED ORDER — PROPOFOL 10 MG/ML IV BOLUS
INTRAVENOUS | Status: DC | PRN
  Administered 2014-08-05 (×3): 20 mg via INTRAVENOUS
  Administered 2014-08-05: 40 mg via INTRAVENOUS
  Administered 2014-08-05 (×4): 20 mg via INTRAVENOUS

## 2014-08-05 MED ORDER — BUPIVACAINE-EPINEPHRINE (PF) 0.5% -1:200000 IJ SOLN
INTRAMUSCULAR | Status: AC
  Filled 2014-08-05: qty 30

## 2014-08-05 MED ORDER — LACTATED RINGERS IV SOLN: 10.0000 mL | INTRAVENOUS | Status: DC

## 2014-08-05 MED ORDER — MIDAZOLAM HCL 2 MG/2ML IJ SOLN
INTRAMUSCULAR | Status: AC
  Filled 2014-08-05: qty 2

## 2014-08-05 MED ORDER — GLYCOPYRROLATE 0.2 MG/ML IJ SOLN: 0.2000 mg | Freq: Once | INTRAMUSCULAR | Status: DC | PRN

## 2014-08-05 MED ORDER — MEPERIDINE HCL 25 MG/ML IJ SOLN: 6.2500 mg | INTRAMUSCULAR | Status: DC | PRN

## 2014-08-05 SURGICAL SUPPLY — 45 items
APL SKNCLS STERI-STRIP NONHPOA (GAUZE/BANDAGES/DRESSINGS) ×1
BENZOIN TINCTURE PRP APPL 2/3 (GAUZE/BANDAGES/DRESSINGS) ×2 IMPLANT
BLADE SURG 15 STRL LF DISP TIS (BLADE) ×1 IMPLANT
BLADE SURG 15 STRL SS (BLADE) ×3
CHLORAPREP W/TINT 26ML (MISCELLANEOUS) ×3 IMPLANT
CLEANER CAUTERY TIP 5X5 PAD (MISCELLANEOUS) IMPLANT
CLOSURE WOUND 1/2 X4 (GAUZE/BANDAGES/DRESSINGS) ×1
COVER BACK TABLE 60X90IN (DRAPES) ×3 IMPLANT
COVER MAYO STAND STRL (DRAPES) ×3 IMPLANT
DECANTER SPIKE VIAL GLASS SM (MISCELLANEOUS) IMPLANT
DRAPE LAPAROTOMY 100X72 PEDS (DRAPES) ×2 IMPLANT
DRAPE U-SHAPE 76X120 STRL (DRAPES) IMPLANT
DRAPE UTILITY XL STRL (DRAPES) ×3 IMPLANT
DRSG TEGADERM 4X4.75 (GAUZE/BANDAGES/DRESSINGS) IMPLANT
ELECT REM PT RETURN 9FT ADLT (ELECTROSURGICAL) ×3
ELECTRODE REM PT RTRN 9FT ADLT (ELECTROSURGICAL) ×1 IMPLANT
GLOVE BIO SURGEON STRL SZ 6.5 (GLOVE) ×1 IMPLANT
GLOVE BIO SURGEONS STRL SZ 6.5 (GLOVE) ×1
GLOVE BIOGEL PI IND STRL 7.0 (GLOVE) IMPLANT
GLOVE BIOGEL PI IND STRL 7.5 (GLOVE) IMPLANT
GLOVE BIOGEL PI INDICATOR 7.0 (GLOVE) ×2
GLOVE BIOGEL PI INDICATOR 7.5 (GLOVE) ×2
GLOVE SURG ORTHO 8.0 STRL STRW (GLOVE) ×3 IMPLANT
GLOVE SURG SS PI 7.5 STRL IVOR (GLOVE) ×2 IMPLANT
GOWN STRL REUS W/ TWL LRG LVL3 (GOWN DISPOSABLE) ×1 IMPLANT
GOWN STRL REUS W/ TWL XL LVL3 (GOWN DISPOSABLE) ×1 IMPLANT
GOWN STRL REUS W/TWL LRG LVL3 (GOWN DISPOSABLE) ×6
GOWN STRL REUS W/TWL XL LVL3 (GOWN DISPOSABLE) ×3
NDL HYPO 25X1 1.5 SAFETY (NEEDLE) ×1 IMPLANT
NEEDLE HYPO 25X1 1.5 SAFETY (NEEDLE) ×3 IMPLANT
PACK BASIN DAY SURGERY FS (CUSTOM PROCEDURE TRAY) ×3 IMPLANT
PAD CLEANER CAUTERY TIP 5X5 (MISCELLANEOUS) ×2
PENCIL BUTTON HOLSTER BLD 10FT (ELECTRODE) ×3 IMPLANT
SHEET MEDIUM DRAPE 40X70 STRL (DRAPES) IMPLANT
SLEEVE SCD COMPRESS KNEE MED (MISCELLANEOUS) IMPLANT
SPONGE GAUZE 4X4 12PLY STER LF (GAUZE/BANDAGES/DRESSINGS) ×3 IMPLANT
STRIP CLOSURE SKIN 1/2X4 (GAUZE/BANDAGES/DRESSINGS) ×1 IMPLANT
SUT ETHILON 3 0 PS 1 (SUTURE) IMPLANT
SUT MNCRL AB 4-0 PS2 18 (SUTURE) ×2 IMPLANT
SUT VICRYL 3-0 CR8 SH (SUTURE) ×3 IMPLANT
SUT VICRYL 4-0 PS2 18IN ABS (SUTURE) IMPLANT
SYR BULB 3OZ (MISCELLANEOUS) ×2 IMPLANT
SYR CONTROL 10ML LL (SYRINGE) ×3 IMPLANT
TOWEL OR 17X24 6PK STRL BLUE (TOWEL DISPOSABLE) ×4 IMPLANT
TOWEL OR NON WOVEN STRL DISP B (DISPOSABLE) ×1 IMPLANT

## 2014-08-05 NOTE — Op Note (Signed)
NAMEEVEREST, HACKING NO.:  [id number]  MEDICAL RECORD NO.:  [id number]  LOCATION: [facility]           FACILITY:  [facility]  PHYSICIAN:  Earnstine Regal, MD      DATE OF BIRTH:  12/20/1940  DATE OF PROCEDURE:  08/05/2014                              OPERATIVE REPORT   PREOPERATIVE DIAGNOSIS:  Soft tissue mass, right posterior hip.  POSTOPERATIVE DIAGNOSIS:  Soft tissue mass, right posterior hip.  PROCEDURE:  Excision of soft tissue mass, right posterior hip (3 x 2 x 2 cm).  SURGEON:  Earnstine Regal, MD, FACS  ANESTHESIA:  Local with intravenous sedation per Lorrene Reid, M.D.  ESTIMATED BLOOD LOSS:  Minimal.  PREPARATION:  ChloraPrep.  COMPLICATIONS:  None.  INDICATIONS:  The patient is a 74 year old female who presents with an enlarging soft tissue mass on the right posterior hip.  This had been initially 2 cm in size and had been present for a number of years.  It had become more firm in nature and is causing discomfort, particularly when lying in a recumbent position.  The patient states that it may be related to a previous injection site during an illness 23 years ago. The patient now comes to the operating room for excision for definitive diagnosis and management of discomfort.  BODY OF REPORT:  Procedure was done in OR #3 at the [facility].  The patient was brought to the operating room, placed in supine position on the operating room table.  Following administration of intravenous sedation, the patient was turned to a left lateral decubitus position and then prepped and draped in the usual aseptic fashion. After ascertaining that an adequate level of sedation had been achieved, the skin was anesthetized with local anesthetic.  A 3 cm incision was made over the mass with a #15 blade.  Dissection carried in the subcutaneous tissues.  Mass was localized by palpation.  Using the electrocautery for hemostasis, the mass was  dissected out circumferentially.  There was a deeper component to the palpable mass which extends down to the iliac crest.  It appears to be somewhat fixed to the underlying bone.  Overall, the mass may measure as large as 5 cm. The mass was dissected out circumferentially from the subcutaneous tissues.  It has a whitish appearance and upon incising the mass, there was a cystic portion containing white-to-pink colored fluid which was evacuated.  There was some necrotic debris.  The rim of the mass appears to be relatively thick walled.  The mass was excised, measuring approximately 3 x 2 x 2 cm and this was submitted in its entirety to Pathology for review.  On palpation, there was a deeper portion of the mass which measures approximately 2 x 3 x 3 cm and appears to be fixed to the underlying bone.  This portion of the mass was not resected.  Wound was irrigated with warm saline.  Good hemostasis was noted throughout.  Subcutaneous tissues were closed with interrupted 3-0 Vicryl sutures.  Skin was reapproximated with a running 4-0 Monocryl subcuticular suture.  Wound was washed and dried and benzoin and Steri- Strips were applied.  Sterile dressings were applied.  The patient was awakened from anesthesia and brought  to the recovery room.  The patient tolerated the procedure well.   Earnstine Regal, MD, [facility] Office: [phone]    TMG/MEDQ  D:  08/05/2014  T:  08/05/2014  Job:  [id number]  cc:   Earnstine Regal, MD

## 2014-08-05 NOTE — Anesthesia Procedure Notes (Signed)
Procedure Name: MAC Date/Time: 08/05/2014 7:33 AM Performed by: Melynda Ripple D Pre-anesthesia Checklist: Patient identified, Timeout performed, Emergency Drugs available, Suction available and Patient being monitored Oxygen Delivery Method: Simple face mask Placement Confirmation: positive ETCO2

## 2014-08-05 NOTE — Brief Op Note (Signed)
08/05/2014  8:17 AM  PATIENT:  [place]  74 y.o. female  PRE-OPERATIVE DIAGNOSIS:  soft tissue mass on posterior hip  POST-OPERATIVE DIAGNOSIS:  soft tissue mass on right posterior hip  PROCEDURE:  Procedure(s): EXCISION MASS POSTERIOR HIP (Right)  SURGEON:  Surgeon(s) and Role:    * Armandina Gemma, MD - Primary  ANESTHESIA:   IV sedation  EBL:  Total I/O In: 400 [I.V.:400] Out: -   BLOOD ADMINISTERED:none  DRAINS: none   LOCAL MEDICATIONS USED:  MARCAINE     SPECIMEN:  Excision  DISPOSITION OF SPECIMEN:  PATHOLOGY  COUNTS:  YES  TOURNIQUET:  * No tourniquets in log *  DICTATION: .Other Dictation: Dictation Number [id number]  PLAN OF CARE: Discharge to home after PACU  PATIENT DISPOSITION:  PACU - hemodynamically stable.   Delay start of Pharmacological VTE agent (>24hrs) due to surgical blood loss or risk of bleeding: yes  Earnstine Regal, MD, [facility] Office: [phone]

## 2014-08-05 NOTE — Anesthesia Postprocedure Evaluation (Signed)
  Anesthesia Post-op Note  Patient: [place]  Procedure(s) Performed: Procedure(s): EXCISION MASS POSTERIOR HIP (Right)  Patient Location: PACU  Anesthesia Type: MAC   Level of Consciousness: awake, alert  and oriented  Airway and Oxygen Therapy: Patient Spontanous Breathing  Post-op Pain: none  Post-op Assessment: Post-op Vital signs reviewed  Post-op Vital Signs: Reviewed  Last Vitals:  Filed Vitals:   08/05/14 0928  BP: 143/79  Pulse: 72  Temp: 36.5 C  Resp: 16    Complications: No apparent anesthesia complications

## 2014-08-05 NOTE — Transfer of Care (Signed)
Immediate Anesthesia Transfer of Care Note  Patient: [place]  Procedure(s) Performed: Procedure(s): EXCISION MASS POSTERIOR HIP (Right)  Patient Location: PACU  Anesthesia Type:MAC  Level of Consciousness: awake, alert  and oriented  Airway & Oxygen Therapy: Patient Spontanous Breathing and Patient connected to face mask oxygen  Post-op Assessment: Report given to RN and Post -op Vital signs reviewed and stable  Post vital signs: Reviewed and stable  Last Vitals:  Filed Vitals:   08/05/14 0637  BP: 159/81  Pulse: 83  Temp: 03.7 C    Complications: No apparent anesthesia complications

## 2014-08-05 NOTE — Discharge Instructions (Signed)

## 2014-08-05 NOTE — Anesthesia Preprocedure Evaluation (Addendum)
Anesthesia Evaluation  Patient identified by MRN, date of birth, ID band Patient awake    Reviewed: Allergy & Precautions, NPO status , Patient's Chart, lab work & pertinent test results  Airway Mallampati: I  TM Distance: >3 FB Neck ROM: Full    Dental  (+) Teeth Intact, Dental Advisory Given   Pulmonary shortness of breath and with exertion, former smoker,  Chronic bronchiectasis breath sounds clear to auscultation        Cardiovascular hypertension, Pt. on medications Rhythm:Regular Rate:Normal     Neuro/Psych negative neurological ROS     GI/Hepatic negative GI ROS, Neg liver ROS,   Endo/Other  negative endocrine ROS  Renal/GU negative Renal ROS     Musculoskeletal   Abdominal   Peds  Hematology negative hematology ROS (+)   Anesthesia Other Findings   Reproductive/Obstetrics                            Anesthesia Physical Anesthesia Plan  ASA: III  Anesthesia Plan: MAC   Post-op Pain Management:    Induction: Intravenous  Airway Management Planned: Simple Face Mask  Additional Equipment:   Intra-op Plan:   Post-operative Plan:   Informed Consent: I have reviewed the patients History and Physical, chart, labs and discussed the procedure including the risks, benefits and alternatives for the proposed anesthesia with the patient or authorized representative who has indicated his/her understanding and acceptance.   Dental advisory given  Plan Discussed with: CRNA, Surgeon and Anesthesiologist  Anesthesia Plan Comments:        Anesthesia Quick Evaluation

## 2014-08-05 NOTE — Interval H&P Note (Signed)
History and Physical Interval Note:  08/05/2014 7:13 AM  [place]  has presented today for surgery, with the diagnosis of soft tissue mass on posterior mass.  The various methods of treatment have been discussed with the patient and family. After consideration of risks, benefits and other options for treatment, the patient has consented to    Procedure(s): EXCISION MASS POSTERIOR HIP (N/A) as a surgical intervention .    The patient's history has been reviewed, patient examined, no change in status, stable for surgery.  I have reviewed the patient's chart and labs.  Questions were answered to the patient's satisfaction.    Earnstine Regal, MD, [facility] Office: [place]

## 2014-08-06 ENCOUNTER — Encounter (HOSPITAL_BASED_OUTPATIENT_CLINIC_OR_DEPARTMENT_OTHER): Payer: Self-pay | Admitting: Surgery

## 2014-09-10 ENCOUNTER — Ambulatory Visit (INDEPENDENT_AMBULATORY_CARE_PROVIDER_SITE_OTHER): Payer: Medicare Other | Admitting: Internal Medicine

## 2014-09-10 ENCOUNTER — Encounter: Payer: Self-pay | Admitting: Internal Medicine

## 2014-09-10 VITALS — BP 108/60 | HR 90 | Ht 68.0 in | Wt 136.0 lb

## 2014-09-10 DIAGNOSIS — J479 Bronchiectasis, uncomplicated: Secondary | ICD-10-CM

## 2014-09-10 LAB — PULMONARY FUNCTION TEST
DL/VA % pred: 79 %
DL/VA: 4.18 ml/min/mmHg/L
DLCO UNC % PRED: 138 %
DLCO unc: 41.05 ml/min/mmHg
FEF 25-75 Post: 1.49 L/sec
FEF 25-75 Pre: 1.22 L/sec
FEF2575-%Change-Post: 22 %
FEF2575-%PRED-POST: 75 %
FEF2575-%Pred-Pre: 61 %
FEV1-%CHANGE-POST: 8 %
FEV1-%PRED-POST: 72 %
FEV1-%Pred-Pre: 67 %
FEV1-PRE: 1.71 L
FEV1-Post: 1.85 L
FEV1FVC-%Change-Post: 3 %
FEV1FVC-%Pred-Pre: 93 %
FEV6-%Change-Post: 4 %
FEV6-%Pred-Post: 79 %
FEV6-%Pred-Pre: 76 %
FEV6-Post: 2.57 L
FEV6-Pre: 2.45 L
FEV6FVC-%PRED-PRE: 104 %
FEV6FVC-%Pred-Post: 104 %
FVC-%Change-Post: 4 %
FVC-%Pred-Post: 76 %
FVC-%Pred-Pre: 72 %
FVC-Post: 2.57 L
FVC-Pre: 2.45 L
POST FEV1/FVC RATIO: 72 %
PRE FEV1/FVC RATIO: 70 %
PRE FEV6/FVC RATIO: 100 %
Post FEV6/FVC ratio: 100 %
RV % pred: 66 %
RV: 1.64 L
TLC % pred: 72 %
TLC: 4.12 L

## 2014-09-10 NOTE — Progress Notes (Signed)
PFT performed today. Nitrogen washout performed. 

## 2014-09-10 NOTE — Progress Notes (Signed)
Subjective:     Patient ID: Jaime Fitzpatrick, female   DOB: 1940-06-08    MRN: [id number]    Brief patient profile:  52 yowf never regular smoker with Bronchiectasis/ab after ARDS age 74 from Pneumoccal pna/sepsis with min residual restrictive/obst changes on pfts 09/10/14     History of Present Illness  04/12/2011 f/u ov/Jaime Fitzpatrick cc cough with  intermittently brown mucus esp ins am assoc with streaky hemoptysis for the past 6 months- started as blood streaks and 2 days ago produced approx 1 tbs RBR.  No abx x 4 months. On asa 81 mg daily. No sign sob or need for daytime saba rec Stop fish oil because of the risk of some of it ending up in your lung Hold aspirin whenever you see excessive amt of bleeding x 3 days off it.    03/18/2012 f/u ov/Jaime Fitzpatrick cc daily x years coughing up to 4 tbsp of brown mucus every am with traces of brb only with one episode of gross hemoptyis 02/26/12  x 1/3 cup better after levaquin x 10 days.  rec Start vest    07/20/2014 f/u ov/Jaime Fitzpatrick re: bronchiectasis   Chief Complaint  Patient presents with  . Follow-up    Pt c/o increased SOB, fatigue, prod cough with yellow mucus. Denies any chest tightness/congestion.   on advair 250 one bid  [place] Worse since 1 year/ was walking 3 miles and now just one mile s stopping some inclines   Cough worse thing in am but rarely bloody now/ only using levaquin for purulent sputum once or twice a year rec Do not use macrobid/ macrodantin/ nitrofurantoin  Stop advair symbicort 80 Take 2 puffs first thing in am and then another 2 puffs about 12 hours later.  Work on Engineer, technical sales technique    09/10/2014 f/u ov/Jaime Fitzpatrick re: bronchiectasis / very min airflow obst on symbicort 80 2bid  Chief Complaint  Patient presents with  . Follow-up    Pt states that her breathing and cough are much improved. No new co's today.    much better off advair, no need for proair at all   No obvious daytime variabilty or assoc   cp or chest tightness,  subjective wheeze overt sinus or hb symptoms. No unusual exp hx or h/o childhood pna/ asthma or premature birth to his knowledge.    At present Sleeping ok without nocturnal  or early am exacerbation  of respiratory  c/o's or need for noct saba. Also denies any obvious fluctuation of symptoms with weather or environmental changes or other aggravating or alleviating factors except as outlined above     Current Medications, Allergies, Past Medical History, Past Surgical History, Family History, and Social History were reviewed in [employer] record.  ROS  The following are not active complaints unless bolded sore throat, dysphagia, dental problems, itching, sneezing,  nasal congestion or excess/ purulent secretions, ear ache,   fever, chills, sweats, unintended wt loss, pleuritic or exertional cp, hemoptysis,  orthopnea pnd or leg swelling, presyncope, palpitations, heartburn, abdominal pain, anorexia, nausea, vomiting, diarrhea  or change in bowel or urinary habits, change in stools or urine, dysuria,hematuria,  rash, arthralgias, visual complaints, headache, numbness weakness or ataxia or problems with walking or coordination,  change in mood/affect or memory.            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 Smoked approx 1 yr socailly in 1968.  ETOH socially        Objective:   Physical Exam  Baseline wt around 140  07/20/2014        137 > 09/10/2014  136  Wt Readings from Last 3 Encounters:  09/18/12 135 lb (61.236 kg)  03/18/12 141 lb 6.4 oz (64.139 kg)  02/26/12 139 lb 3.2 oz (63.141 kg)    amb pleasant wf nad with  nl vital signs  HEENT mild turbinate edema. Oropharynx no thrush or excess pnd or cobblestoning. No JVD or cervical adenopathy. Mild accessory muscle hypertrophy. Trachea midline, nl thryroid. Chest was min hyperinflated by percussion with diminished breath sounds and moderate increased exp time with min rhonchi on exp L > R . Hoover sign positive at end inspiration. Regular rate and rhythm without murmur gallop or rub or increase P2 or edema. Abd: no hsm, nl excursion. Ext warm without cyanosis or clubbing.      I personally reviewed images and agree with radiology impression as follows:  CXR:  07/20/14  Cardiac shadow is stable. Scarring is again seen in the apices bilaterally. Diffuse interstitial changes are noted and stable. Bilateral breast implants are seen. No acute bony abnormality is noted.         Assessment:

## 2014-09-10 NOTE — Assessment & Plan Note (Addendum)
Followed in Pulmonary clinic/ [facility]/ [place]   - CT chest 02/26/12 1. Severe chronic upper lobe bronchiectasis worse on the left.  2. Small cavitary lesion with central nodule within left upper  lobe may represent mycetoma.  3. Reticular nodular pattern within the right middle lobe suggest  chronic infection such as MAI  4. Irregular nodule in the left lung base - Vest added 03/28/12  - 07/20/2014 p extensive coaching HFA effectiveness =    75%  Try symbicort 80 2bid 07/20/2014  - PFTs  09/10/2014  FEV1  1.85 ( 72%) ratio 72 and dlco 79%    Adequate control on present rx, reviewed > no change in rx needed    I had an extended discussion with the patient reviewing all relevant studies completed to date and  lasting 15 to 20 minutes of a 25 minute visit    Each maintenance medication was reviewed in detail including most importantly the difference between maintenance and prns and under what circumstances the prns are to be triggered using an action plan format that is not reflected in the computer generated alphabetically organized AVS.    Please see instructions for details which were reviewed in writing and the patient given a copy highlighting the part that I personally wrote and discussed at today's ov.

## 2014-09-10 NOTE — Patient Instructions (Signed)
No change meds  Follow up can be yearly for refills(if not done by your primary )/ sooner if needed

## 2014-09-11 ENCOUNTER — Encounter: Payer: Self-pay | Admitting: Internal Medicine

## 2015-04-11 ENCOUNTER — Ambulatory Visit: Payer: Medicare Other | Admitting: Internal Medicine

## 2015-08-23 ENCOUNTER — Encounter: Payer: Self-pay | Admitting: Internal Medicine

## 2015-09-21 ENCOUNTER — Telehealth: Payer: Self-pay | Admitting: Internal Medicine

## 2015-09-21 MED ORDER — ALBUTEROL SULFATE HFA 108 (90 BASE) MCG/ACT IN AERS: 2.0000 | INHALATION_SPRAY | RESPIRATORY_TRACT | 1 refills | Status: DC | PRN

## 2015-09-21 NOTE — Telephone Encounter (Signed)
Pt notes that she only needs a rescue inhaler; aware that Proair HFA has been sent to confirmed pharmacy ([facility]) as well as to keep OV with MW 10-19-15;sooner if she notices she is using HFA more than twice a week. Nothing more needed at this time.

## 2015-09-21 NOTE — Telephone Encounter (Signed)
Spoke with pt, states her dentist and oral surgeon have requested she call our office to request a rx for a bronchodilator.  I advised pt she has not been seen in over 1 year.  Schedule rov for pt.   MW please advise on which bronchodilator you recommend.  Thanks!   Instructions  No change meds   Follow up can be yearly for refills(if not done by your primary )/ sooner if needed

## 2015-09-21 NOTE — Telephone Encounter (Signed)
Notes say she is already on symbicort which should be fine - if she' s feeling she needs something different then I would need to see her to sort out what it is she needs  If it's just to have a rescue inhaler, ok to rx with proair 2 q 4 h prn as long as comes to see me if need for rescue is more than twice a week

## 2015-09-28 ENCOUNTER — Other Ambulatory Visit: Payer: Self-pay | Admitting: Internal Medicine

## 2015-09-28 DIAGNOSIS — J479 Bronchiectasis, uncomplicated: Secondary | ICD-10-CM

## 2015-10-06 ENCOUNTER — Ambulatory Visit (HOSPITAL_COMMUNITY)
Admission: RE | Admit: 2015-10-06 | Discharge: 2015-10-06 | Disposition: A | Discharge status: Home / Self Care | Payer: Medicare Other | Source: Ambulatory Visit | Attending: Internal Medicine | Admitting: Internal Medicine

## 2015-10-06 ENCOUNTER — Encounter (HOSPITAL_COMMUNITY): Payer: Self-pay

## 2015-10-06 DIAGNOSIS — M810 Age-related osteoporosis without current pathological fracture: Secondary | ICD-10-CM | POA: Diagnosis present

## 2015-10-06 MED ORDER — ZOLEDRONIC ACID 5 MG/100ML IV SOLN
5.0000 mg | Freq: Once | INTRAVENOUS | Status: AC
Start: 2015-10-06 — End: 2015-10-06
  Administered 2015-10-06: 5 mg via INTRAVENOUS
  Filled 2015-10-06: qty 100

## 2015-10-06 MED ORDER — SODIUM CHLORIDE 0.9 % IV SOLN
Freq: Once | INTRAVENOUS | Status: AC
  Administered 2015-10-06: 14:00:00 via INTRAVENOUS

## 2015-10-06 NOTE — Discharge Instructions (Signed)

## 2015-10-06 NOTE — Progress Notes (Signed)
Tolerated Reclast infusion well. No signs of adverse reaction noted. Instructed to call Dr. Danna Hefty office for problems and concerns once discharged from Short Stay. Patient verbalized understanding.

## 2015-10-19 ENCOUNTER — Ambulatory Visit: Payer: Medicare Other | Admitting: Internal Medicine

## 2015-11-09 ENCOUNTER — Encounter: Payer: Self-pay | Admitting: Internal Medicine

## 2015-11-09 ENCOUNTER — Ambulatory Visit (INDEPENDENT_AMBULATORY_CARE_PROVIDER_SITE_OTHER): Payer: Medicare Other | Admitting: Internal Medicine

## 2015-11-09 VITALS — BP 106/74 | HR 80 | Ht 69.0 in | Wt 137.0 lb

## 2015-11-09 DIAGNOSIS — J479 Bronchiectasis, uncomplicated: Secondary | ICD-10-CM

## 2015-11-09 NOTE — Assessment & Plan Note (Addendum)
-   CT chest 02/26/12 1. Severe chronic upper lobe bronchiectasis worse on the left.  2. Small cavitary lesion with central nodule within left upper  lobe may represent mycetoma.  3. Reticular nodular pattern within the right middle lobe suggest  chronic infection such as MAI  4. Irregular nodule in the left lung base - Vest added 03/28/12  - 07/20/2014   Try symbicort 80 2bid   - PFTs  09/10/2014  FEV1  1.85 ( 72%) ratio 72 and dlco 79%    - 11/09/2015  After extensive coaching HFA effectiveness =    90%    She's really doing well despite significant bronchiectasis L >R the result of pneumoccal pna/ ards/ vent dep   I had an extended final summary discussion with the patient reviewing all relevant studies completed to date and  lasting 15 to 20 minutes of a 25 minute visit on the following issues:    Each maintenance medication was reviewed in detail including most importantly the difference between maintenance and as needed and under what circumstances the prns are to be used.  Please see instructions for details which were reviewed in writing and the patient given a copy.    rec continue symbicort 80 2bid and prn saba/ pulmonary f/u can be prn

## 2015-11-09 NOTE — Patient Instructions (Signed)
Plan A = Automatic = Symbicort 80 Take 2 puffs first thing in am and then another 2 puffs about 12 hours later.    Work on maintaining  inhaler technique:  relax and gently blow all the way out then take a nice smooth deep breath back in, triggering the inhaler at same time you start breathing in.  Hold for up to 5 seconds if you can. Blow out thru nose. Rinse and gargle with water when done - brush your teeth and gargle after use with arm and hammer toothpaste    Plan B = Backup Only use your albuterol(proair)  as a rescue medication to be used if you can't catch your breath by resting or doing a relaxed purse lip breathing pattern.  - The less you use it, the better it will work when you need it. - Ok to use the inhaler up to 2 puffs  every 4 hours if you must but call for appointment if use goes up over your usual need - Don't leave home without it !!  (think of it like the spare tire for your car)    Follow up per Primary Care planned

## 2015-11-09 NOTE — Progress Notes (Signed)
Subjective:     Patient ID: Jaime Fitzpatrick, female   DOB: 02-25-1940    MRN: [id number]    Brief patient profile:  21 yowf never regular smoker with Bronchiectasis/ab after ARDS age 75 from Pneumoccal pna/sepsis with min residual restrictive/obst changes on pfts 09/10/14     History of Present Illness   09/10/2014 f/u ov/Arely Tinner re: bronchiectasis / very min airflow obst on symbicort 80 2bid  Chief Complaint  Patient presents with  . Follow-up    Pt states that her breathing and cough are much improved. No new co's today.    much better off advair, no need for proair at all  rec No change rx   11/09/2015  f/u ov/Taleya Whitcher re: bronchiectasis maint rx symb 80 2bid/ rare saba  Chief Complaint  Patient presents with  . Follow-up    Pt states that her breathing and cough are unchanged. Her cough is prod with green sputum.     no recent hemoptysis or need for abx / Not limited by breathing from desired activities     No obvious daytime variabilty or assoc   cp or chest tightness, subjective wheeze overt sinus or hb symptoms. No unusual exp hx or h/o childhood pna/ asthma or premature birth to his knowledge.    At present Sleeping ok without nocturnal  or early am exacerbation  of respiratory  c/o's or need for noct saba. Also denies any obvious fluctuation of symptoms with weather or environmental changes or other aggravating or alleviating factors except as outlined above     Current Medications, Allergies, Past Medical History, Past Surgical History, Family History, and Social History were reviewed in [facility] record.  ROS  The following are not active complaints unless bolded sore throat, dysphagia, dental problems, itching, sneezing,  nasal congestion or excess/ purulent secretions, ear ache,   fever, chills, sweats, unintended wt loss, pleuritic or exertional cp, hemoptysis,  orthopnea pnd or leg swelling, presyncope, palpitations, heartburn, abdominal pain,  anorexia, nausea, vomiting, diarrhea  or change in bowel or urinary habits, change in stools or urine, dysuria,hematuria,  rash, arthralgias, visual complaints, headache, numbness weakness or ataxia or problems with walking or coordination,  change in mood/affect or memory.            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 Smoked approx 1 yr socailly in 1968.  ETOH socially        Objective:   Physical Exam  Baseline wt around 140  07/20/2014        137 > 09/10/2014  136 >  11/09/2015  138     09/18/12 135 lb (61.236 kg)  03/18/12 141 lb 6.4 oz (64.139 kg)  02/26/12 139 lb 3.2 oz (63.141 kg)    amb pleasant wf nad with nl vital signs - - Note on arrival 02 sats  96% on RA    HEENT mild turbinate edema. Oropharynx no thrush or excess pnd or cobblestoning. No JVD or cervical adenopathy. Mild accessory muscle hypertrophy. Trachea midline, nl thryroid. Chest was min hyperinflated by percussion with diminished breath sounds and moderate increased exp time with mild/ moderate  rhonchi on exp> insp on  L > R . Hoover sign positive at end inspiration.  Regular rate and rhythm without murmur gallop or rub or increase P2 or edema. Abd: no hsm, nl excursion. Ext warm without cyanosis or clubbing.      I personally reviewed images and agree with radiology impression as follows:  CXR:  07/20/14  Cardiac shadow is stable. Scarring is again seen in the apices bilaterally. Diffuse interstitial changes are noted and stable. Bilateral breast implants are seen. No acute bony abnormality is noted.         Assessment:

## 2015-11-16 ENCOUNTER — Other Ambulatory Visit (HOSPITAL_COMMUNITY): Payer: Self-pay | Admitting: Internal Medicine

## 2015-11-16 DIAGNOSIS — R002 Palpitations: Secondary | ICD-10-CM

## 2015-11-17 ENCOUNTER — Ambulatory Visit (HOSPITAL_COMMUNITY)
Admission: RE | Admit: 2015-11-17 | Discharge: 2015-11-17 | Disposition: A | Discharge status: Home / Self Care | Payer: Medicare Other | Source: Ambulatory Visit | Attending: Internal Medicine | Admitting: Internal Medicine

## 2015-11-17 DIAGNOSIS — R002 Palpitations: Secondary | ICD-10-CM | POA: Insufficient documentation

## 2015-11-17 NOTE — Progress Notes (Signed)
  Echocardiogram 2D Echocardiogram has been performed.  Jennette Dubin 11/17/2015, 2:57 PM

## 2015-11-21 ENCOUNTER — Other Ambulatory Visit: Payer: Self-pay | Admitting: Internal Medicine

## 2015-11-21 DIAGNOSIS — J479 Bronchiectasis, uncomplicated: Secondary | ICD-10-CM

## 2016-07-16 ENCOUNTER — Telehealth: Payer: Self-pay | Admitting: Internal Medicine

## 2016-07-16 DIAGNOSIS — R05 Cough: Secondary | ICD-10-CM

## 2016-07-16 DIAGNOSIS — R0602 Shortness of breath: Secondary | ICD-10-CM

## 2016-07-16 DIAGNOSIS — R059 Cough, unspecified: Secondary | ICD-10-CM

## 2016-07-16 NOTE — Telephone Encounter (Signed)
That's fine but levaquin at 750 mg daily should be the dose in the meantime

## 2016-07-16 NOTE — Telephone Encounter (Signed)
Add on to end of day with cxr first

## 2016-07-16 NOTE — Telephone Encounter (Signed)
Spoke with pt, who states she only has enough Levaquin to last until Tomorrow. Pt state she will call back with name and number of pharmacy she wishes for Rx to be sent to.  MW does not have any availability on Thursday. I have offered apt with another provider. pt states she only wishes to see Dr. Melvyn Novas.  MW please advise where pt can be DB on 07/19/16. Thanks.

## 2016-07-16 NOTE — Telephone Encounter (Signed)
Spoke with pt. States that she is not feeling well and is out of state. Reports cough, SOB, chest tightness and wheezing. Cough is green mucus. Denies fever. Symptoms started 4 days ago. Pt is currently on Levaquin, she would not elaborate on what dosage. I advised that she should see someone in [place], she declined. Pt would like an appointment with MW on Thursday when she turns to town.  MW - please advise if we can double book your schedule. Thanks.

## 2016-07-16 NOTE — Telephone Encounter (Signed)
Pt has been scheduled for acute OV with CXR prior. CXR has been ordered. Pt is aware and voiced her understanding.  Nothing further needed at this time.

## 2016-07-17 ENCOUNTER — Telehealth: Payer: Self-pay | Admitting: Internal Medicine

## 2016-07-17 MED ORDER — ZOLPIDEM TARTRATE 5 MG PO TABS: 5.0000 mg | ORAL_TABLET | Freq: Every evening | ORAL | 0 refills | Status: DC | PRN

## 2016-07-17 MED ORDER — LEVOFLOXACIN 750 MG PO TABS: 750.0000 mg | ORAL_TABLET | Freq: Every day | ORAL | 0 refills | Status: DC

## 2016-07-17 NOTE — Telephone Encounter (Signed)
That's fine ambien 5 mg 1-2 hs prn #16  No refills

## 2016-07-17 NOTE — Telephone Encounter (Signed)
Xanax 0.5 mg qhs prn  #15

## 2016-07-17 NOTE — Telephone Encounter (Signed)
Rx for 2 tabs of Levaquin have been sent to preferred pharmacy to get pt by until her acute visit on 07/19/16. Per 07/16/16 phone note. MW instructed pt to continue Levaquin 750mg  until f/u, pt took last dose today.  Pt also request Rx for sleep aide, as she has not sleep in 4-5 night.  MW please advise. Thanks.

## 2016-07-17 NOTE — Telephone Encounter (Signed)
Patient called back.  Stating she is calling with pharmacy information for an RX that was to be sent in for her today, as she is out of town, Sherian Rein is [facility], [place] in [place], phone # [phone]

## 2016-07-17 NOTE — Telephone Encounter (Signed)
Spoke with the pt  Rx for Jaime Fitzpatrick was sent to her [facility] in [place]  Nothing further needed

## 2016-07-17 NOTE — Telephone Encounter (Signed)
Pt is currently out of town and can not pick Rx up. Xanax can not be called into pharmacy. Pt states she has taken [place] previously, which can be phoned in.  MW please advise. Thanks.

## 2016-07-19 ENCOUNTER — Encounter: Payer: Self-pay | Admitting: Internal Medicine

## 2016-07-19 ENCOUNTER — Ambulatory Visit (INDEPENDENT_AMBULATORY_CARE_PROVIDER_SITE_OTHER): Payer: Medicare Other | Admitting: Internal Medicine

## 2016-07-19 ENCOUNTER — Ambulatory Visit (INDEPENDENT_AMBULATORY_CARE_PROVIDER_SITE_OTHER)
Admission: RE | Admit: 2016-07-19 | Discharge: 2016-07-19 | Disposition: A | Discharge status: Home / Self Care | Payer: Medicare Other | Source: Ambulatory Visit | Attending: Internal Medicine | Admitting: Internal Medicine

## 2016-07-19 VITALS — BP 104/70 | HR 106 | Ht 69.0 in | Wt 131.0 lb

## 2016-07-19 DIAGNOSIS — J479 Bronchiectasis, uncomplicated: Secondary | ICD-10-CM

## 2016-07-19 DIAGNOSIS — R059 Cough, unspecified: Secondary | ICD-10-CM

## 2016-07-19 DIAGNOSIS — J471 Bronchiectasis with (acute) exacerbation: Secondary | ICD-10-CM | POA: Diagnosis not present

## 2016-07-19 DIAGNOSIS — R05 Cough: Secondary | ICD-10-CM | POA: Diagnosis not present

## 2016-07-19 DIAGNOSIS — R0602 Shortness of breath: Secondary | ICD-10-CM

## 2016-07-19 MED ORDER — PREDNISONE 10 MG PO TABS: ORAL_TABLET | ORAL | 0 refills | Status: DC

## 2016-07-19 MED ORDER — FLUTTER DEVI: 0 refills | Status: AC

## 2016-07-19 NOTE — Patient Instructions (Addendum)
For cough / congestion > mucinex  1200 mg every 12 hours and use the flutter valve as much as possible   Try prilosec otc 20mg   Take 30-60 min before first meal of the day and Pepcid ac (famotidine) 20 mg one @  bedtime until cough is completely gone for at least a week without the need for cough suppression     GERD (REFLUX)  is an extremely common cause of respiratory symptoms just like yours , many times with no obvious heartburn at all.    It can be treated with medication, but also with lifestyle changes including elevation of the head of your bed (ideally with 6 inch  bed blocks),  Smoking cessation, avoidance of late meals, excessive alcohol, and avoid fatty foods, chocolate, peppermint, colas, red wine, and acidic juices such as orange juice.  NO MINT OR MENTHOL PRODUCTS SO NO COUGH DROPS  USE SUGARLESS CANDY INSTEAD (Jolley ranchers or Stover's or Life Savers) or even ice chips will also do - the key is to swallow to prevent all throat clearing. NO OIL BASED VITAMINS - use powdered substitutes.    Prednisone 10 mg take  4 each am x 2 days,   2 each am x 2 days,  1 each am x 2 days and stop    Work on inhaler technique:  relax and gently blow all the way out then take a nice smooth deep breath back in, triggering the inhaler at same time you start breathing in.  Hold for up to 5 seconds if you can. Blow out thru nose. Rinse and gargle with water when done      If breathing gets worse again will need to go to ER for CTangiogram of chest

## 2016-07-19 NOTE — Progress Notes (Signed)
Subjective:     Patient ID: Jaime Fitzpatrick, female   DOB: Jul 04, 1940    MRN: [id number]    Brief patient profile:  76 yowf never regular smoker with Bronchiectasis/ab after ARDS age 76 from [place] pna/sepsis with min residual restrictive/obst changes on pfts 09/10/14 from [place] pna/sepsis with min residual restrictive/obst changes on pfts 09/10/14     History of Present Illness   11/09/2015  f/u ov/Jaime Fitzpatrick re: bronchiectasis maint rx symb 80 2bid/ rare saba  Chief Complaint  Patient presents with  . Follow-up    Pt states that her breathing and cough are unchanged. Her cough is prod with green sputum.    no recent hemoptysis or need for abx / Not limited by breathing from desired activities   No obvious daytime variabilty or assoc   cp or chest tightness, subjective wheeze overt sinus or hb symptoms. No unusual exp hx or h/o childhood pna/ asthma or premature birth to his knowledge.  rec Plan A = Automatic = Symbicort 80 Take 2 puffs first thing in am and then another 2 puffs about 12 hours later.  Work on maintaining  inhaler technique:  Plan B = Backup Only use your albuterol(proair)     07/19/2016 acute extended ov/Jaime Fitzpatrick re: cough plus sob on symb 80 2bid has saba/ not using  Chief Complaint  Patient presents with  . Acute Visit    Pt c/o increased SOB for the past wk. She is not coughing more than usual, and is producing some green sputum.  She states she is having SOB with walking from room to room "yesterday I could not walk 10 steps"- some better today.   only used levaquin once in jan 2018  once in May for uti About 7 days after last flight started worse nasal congestion then cough more green mucus then sob  Breathing much better today vs yesterday, mucus now clear Generalized upper abd pain with coughing "like a pulled muscle"  Overall trending better at end of levquin (today0   No obvious day to day or daytime variability or assoc   mucus plugs or hemoptysis or cp or chest tightness, subjective wheeze or overt sinus or hb symptoms. No unusual exp hx or  h/o childhood pna/ asthma or knowledge of premature birth.  Sleeping ok without nocturnal  or early am exacerbation  of respiratory  c/o's or need for noct saba. Also denies any obvious fluctuation of symptoms with weather or environmental changes or other aggravating or alleviating factors except as outlined above   Current Medications, Allergies, Complete Past Medical History, Past Surgical History, Family History, and Social History were reviewed in [facility] record.  ROS  The following are not active complaints unless bolded sore throat, dysphagia, dental problems, itching, sneezing,  nasal congestion or excess/ purulent secretions, ear ache,   fever, chills, sweats, unintended wt loss, classically pleuritic or exertional cp,  orthopnea pnd or leg swelling, presyncope, palpitations, abdominal pain, anorexia, nausea, vomiting, diarrhea  or change in bowel or bladder habits, change in stools or urine, dysuria,hematuria,  rash, arthralgias, visual complaints, headache, numbness, weakness or ataxia or problems with walking or coordination,  change in mood/affect or memory.                       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  ETOH socially        Objective:   Physical Exam     07/20/2014        137 > 09/10/2014  136 >  11/09/2015  138 > 07/19/2016  131     09/18/12 135 lb (61.236 kg)  03/18/12 141 lb 6.4 oz (64.139 kg)  02/26/12 139 lb 3.2 oz (63.141 kg)    Animated amb pleasant wf nad with nl vital signs - -  - Note on arrival 02 sats  97% on RA    HEENT: nl dentition,  turbinates bilaterally, and oropharynx. Nl external ear canals without cough reflex   NECK :  without JVD/Nodes/TM/ nl carotid upstrokes bilaterally   LUNGS: no acc muscle use,  Nl contour chest with coarse insp and exp rhonchi bilaterally    CV:  RRR  no s3 or murmur or increase in P2, and no edema   ABD:  soft and nontender with nl inspiratory excursion in the supine position. No bruits or organomegaly appreciated, bowel sounds nl  MS:  Nl gait/ ext warm without deformities, calf tenderness, cyanosis or clubbing No obvious joint restrictions   SKIN: warm and dry without lesions    NEURO:  alert, approp, nl sensorium with  no motor or cerebellar deficits apparent.           CXR PA and Lateral:   07/19/2016 :    I personally reviewed images and agree with radiology impression as follows:    Extensive chronic bronchitic, post inflammatory changes of both lungs. The overall appearance of the pulmonary interstitium is worse than on the previous study and may reflect superimposed acute interstitial pneumonia.    Assessment:

## 2016-07-20 ENCOUNTER — Telehealth: Payer: Self-pay | Admitting: Internal Medicine

## 2016-07-20 NOTE — Assessment & Plan Note (Addendum)
-   The proper method of use, as well as anticipated side effects, of a metered-dose inhaler are discussed and demonstrated to the patient. Improved effectiveness after extensive coaching during this visit to a level of approximately 75 % from a baseline of 50 % (Ti initially too short)   She appears to be having a typical flare with abd pain from coughing which she says is no different from past flares and is most concerned about the sob but never took saba intended for this purpose.  I am also concerned about her cxr and recent travel (Pos risk dvt/ pe) but she declined to schedule a CTa in am and is off to [place] with there grand daughter first thing in am so asked her to call immediately if following rx not effective  1) no more levaquin since mucus no longer purulent  2) continue maint symb and prn saba 3) flutter valve training/ max mucinex /prn vest  4) rx gerd short term with acid suppression/ diet to avoid cyclical cough pattern 5) Prednisone 10 mg take  4 each am x 2 days,   2 each am x 2 days,  1 each am x 2 days and stop   I had an extended discussion with the patient reviewing all relevant studies completed to date and  lasting 15 to 20 minutes of a 25 minute acute  visit    Each maintenance medication was reviewed in detail including most importantly the difference between maintenance and prns and under what circumstances the prns are to be triggered using an action plan format that is not reflected in the computer generated alphabetically organized AVS.    Please see AVS for specific instructions unique to this visit that I personally wrote and verbalized to the the pt in detail and then reviewed with pt  by my nurse highlighting any  changes in therapy recommended at today's visit to their plan of care.

## 2016-07-20 NOTE — Telephone Encounter (Signed)
Spoke with pt. States that she got her prescription for prednisone from her husband. She does not need [place] to send in this prescription anymore. Message will be closed.

## 2016-07-20 NOTE — Assessment & Plan Note (Addendum)
-   CT chest 02/26/12 1. Severe chronic upper lobe bronchiectasis worse on the left.  2. Small cavitary lesion with central nodule within left upper  lobe may represent mycetoma.  3. Reticular nodular pattern within the right middle lobe suggest  chronic infection such as MAI  4. Irregular nodule in the left lung base - Vest added 03/28/12  - 07/20/2014   Try symbicort 80 2bid   - PFTs  09/10/2014  FEV1  1.85 ( 72%) ratio 72 and dlco 79%  - flutter added 07/19/2016   See acute bronchiectasis a/p Flutter valve training completed

## 2016-07-24 ENCOUNTER — Telehealth: Payer: Self-pay | Admitting: Internal Medicine

## 2016-07-24 NOTE — Telephone Encounter (Signed)
Noted. Pt will not be called back per her request. Nothing further was needed.

## 2016-07-25 ENCOUNTER — Ambulatory Visit: Payer: Medicare Other | Admitting: Internal Medicine

## 2016-10-23 ENCOUNTER — Ambulatory Visit (HOSPITAL_COMMUNITY)
Admission: RE | Admit: 2016-10-23 | Discharge: 2016-10-23 | Disposition: A | Discharge status: Home / Self Care | Payer: Medicare Other | Source: Ambulatory Visit | Attending: Internal Medicine | Admitting: Internal Medicine

## 2016-12-05 ENCOUNTER — Telehealth: Payer: Self-pay | Admitting: Internal Medicine

## 2016-12-05 DIAGNOSIS — J479 Bronchiectasis, uncomplicated: Secondary | ICD-10-CM

## 2016-12-05 MED ORDER — BUDESONIDE-FORMOTEROL FUMARATE 80-4.5 MCG/ACT IN AERO: INHALATION_SPRAY | RESPIRATORY_TRACT | 5 refills | Status: DC

## 2016-12-05 NOTE — Telephone Encounter (Signed)
Rx sent to the pharmacy but unable to leave message to let pt know. Will try again later.

## 2016-12-07 NOTE — Telephone Encounter (Signed)
Called and spoke with pts husband and he is aware of refill that has been sent to the pharmacy.

## 2016-12-17 ENCOUNTER — Ambulatory Visit (HOSPITAL_COMMUNITY): Payer: Medicare Other

## 2016-12-28 ENCOUNTER — Ambulatory Visit (HOSPITAL_COMMUNITY)
Admission: RE | Admit: 2016-12-28 | Discharge: 2016-12-28 | Disposition: A | Discharge status: Home / Self Care | Payer: Medicare Other | Source: Ambulatory Visit | Attending: Internal Medicine | Admitting: Internal Medicine

## 2016-12-28 ENCOUNTER — Encounter (HOSPITAL_COMMUNITY): Payer: Self-pay

## 2016-12-28 DIAGNOSIS — M810 Age-related osteoporosis without current pathological fracture: Secondary | ICD-10-CM | POA: Diagnosis not present

## 2016-12-28 MED ORDER — ZOLEDRONIC ACID 5 MG/100ML IV SOLN
5.0000 mg | Freq: Once | INTRAVENOUS | Status: AC
  Administered 2016-12-28: 5 mg via INTRAVENOUS
  Filled 2016-12-28: qty 100

## 2016-12-28 MED ORDER — SODIUM CHLORIDE 0.9 % IV SOLN
Freq: Once | INTRAVENOUS | Status: AC
  Administered 2016-12-28: 11:00:00 via INTRAVENOUS

## 2016-12-28 NOTE — Discharge Instructions (Signed)

## 2017-02-04 ENCOUNTER — Other Ambulatory Visit: Payer: Self-pay | Admitting: *Deleted

## 2017-02-04 DIAGNOSIS — J479 Bronchiectasis, uncomplicated: Secondary | ICD-10-CM

## 2017-02-04 MED ORDER — BUDESONIDE-FORMOTEROL FUMARATE 80-4.5 MCG/ACT IN AERO: INHALATION_SPRAY | RESPIRATORY_TRACT | 5 refills | Status: DC

## 2017-06-19 ENCOUNTER — Ambulatory Visit (INDEPENDENT_AMBULATORY_CARE_PROVIDER_SITE_OTHER)
Admission: RE | Admit: 2017-06-19 | Discharge: 2017-06-19 | Disposition: A | Discharge status: Home / Self Care | Payer: Medicare Other | Source: Ambulatory Visit | Attending: Adult Health | Admitting: Adult Health

## 2017-06-19 ENCOUNTER — Ambulatory Visit: Payer: Medicare Other | Admitting: Adult Health

## 2017-06-19 ENCOUNTER — Encounter: Payer: Self-pay | Admitting: Adult Health

## 2017-06-19 DIAGNOSIS — J471 Bronchiectasis with (acute) exacerbation: Secondary | ICD-10-CM

## 2017-06-19 MED ORDER — LEVOFLOXACIN 500 MG PO TABS: 500.0000 mg | ORAL_TABLET | Freq: Every day | ORAL | 1 refills | Status: DC

## 2017-06-19 NOTE — Progress Notes (Signed)
Chart and office note reviewed in detail  > agree with a/p as outlined    

## 2017-06-19 NOTE — Patient Instructions (Signed)
Chest x-ray today, I will call with results. Levaquin 500mg  daily for 1 week.  Take with food.  Mucinex DM twice daily as needed for cough congestion Use flutter valve at least 3 times daily Fluids and rest Tylenol as needed for fever Follow-up with Dr. Melvyn Novas in 4 to 6 weeks and as needed Please contact office for sooner follow up if symptoms do not improve or worsen or seek emergency care

## 2017-06-19 NOTE — Progress Notes (Signed)
@Patient  ID: Jaime Fitzpatrick, female    DOB: 03-20-1940, 77 y.o.   MRN: [id number]  Chief Complaint  Patient presents with  . Acute Visit    Cough     Referring provider: Prince Solian, MD  HPI: 77 year old female never a regular smoker followed for bronchiectasis and asthmatic bronchitis after ARDS at age 53 from pneumococcal pneumonia and sepsis with minimal residual restricted and obstructive changes on PFT (08/2014)  06/19/2017 Acute OV : Cough  Patient presents for an acute office visit.  She complains over the last 4 days that she has had increased cough with thick green mucus,  body aches and fatigue.  She had lower energy over the last 1 to 2 days.. Husband and family members had similar symptoms .  Patient says appetite is decreased but is eating and drinking with no nausea vomiting or diarrhea. She is taking Mucinex DM and Tylenol. She has known bronchiectasis.  Has not been using her flutter valve on a consistent basis.  Does continue to do percussion intermittently.  She remains on Symbicort twice daily. She denies any known fever however had a low-grade fever today on exam. No chest pain, hemoptysis, abdominal pain, urinary symptoms, edema.  Allergies  Allergen Reactions  . Acetaminophen-Codeine     REACTION: unspecified  . Amoxicillin     REACTION: unspecified  . Avelox [Moxifloxacin Hcl In Nacl]     hives  . Erythromycin Ethylsuccinate     REACTION: unspecified    Immunization History  Administered Date(s) Administered  . DTP 01/30/2003  . H1N1 01/20/2008  . Influenza Split 09/30/2011  . Influenza Whole 09/30/2015  . Influenza, High Dose Seasonal PF 10/29/2016  . Influenza,inj,Quad PF,6+ Mos 10/29/2013  . Pneumococcal Conjugate-13 12/13/2013  . Pneumococcal Polysaccharide-23 01/20/2009    Past Medical History:  Diagnosis Date  . ARDS (adult respiratory distress syndrome) ([place])   . Bronchiectasis   . Hypertension   . 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 Nephrolithiasis   . Palpitations   . Pneumococcal pneumonia ([place])   . Reflux   . Shortness of breath dyspnea    going up stairs    Tobacco History: Social History   Tobacco Use  Smoking Status Former Smoker  . Packs/day: 0.10  . Years: 1.00  . Pack years: 0.10  . Types: Cigarettes  . Last attempt to quit: 01/29/1966  . Years since quitting: 51.4  Smokeless Tobacco Never Used  Tobacco Comment   only smoked 1 year socially.    Counseling given: Not Answered Comment: only smoked 1 year socially.      Review of Systems  Constitutional:   No  weight loss, night sweats,  Fevers, chills,  +fatigue, or  lassitude.  HEENT:   No headaches,  Difficulty swallowing,  Tooth/dental problems, or  Sore throat,                No sneezing, itching, ear ache, nasal congestion, post nasal drip,   CV:  No chest pain,  Orthopnea, PND, swelling in lower extremities, anasarca, dizziness, palpitations, syncope.   GI  No heartburn, indigestion, abdominal pain, nausea, vomiting, diarrhea, change in bowel habits, loss of appetite, bloody stools.   Resp: No chest wall deformity  Skin: no rash or lesions.  GU: no dysuria, change in color of urine, no urgency or frequency.  No flank pain, no hematuria   MS:  No joint pain or swelling.  No decreased range of motion.  No  back pain.    Physical Exam  BP 128/70 (BP Location: Left Arm, Cuff Size: Normal)   Pulse (!) 112   Temp 99 F (37.2 C) (Oral)   Ht 5\' 9"  (1.753 m)   Wt 130 lb (59 kg)   SpO2 93%   BMI 19.20 kg/m   GEN: A/Ox3; pleasant , NAD, thin female   HEENT:  [place]/AT,  EACs-clear, TMs-wnl, NOSE-clear drainage, THROAT-clear, no lesions, no postnasal drip or exudate noted.   NECK:  Supple w/ fair ROM; no JVD; normal carotid impulses w/o bruits; no thyromegaly or nodules palpated; no lymphadenopathy.    RESP few trace rhonchi  no accessory muscle use, no dullness to percussion  CARD:  RRR, no m/r/g, no peripheral  edema, pulses intact, no cyanosis or clubbing.  GI:   Soft & nt; nml bowel sounds; no organomegaly or masses detected.   Musco: Warm bil, no deformities or joint swelling noted.   Neuro: alert, no focal deficits noted.    Skin: Warm, no lesions or rashes    Lab Results:  CBC  BMET No results found for: NA, K, CL, CO2, GLUCOSE, BUN, CREATININE, CALCIUM, GFRNONAA, GFRAA  BNP No results found for: BNP  ProBNP No results found for: PROBNP  Imaging: No results found.   Assessment & Plan:   Bronchiectasis with acute exacerbation ([place]) Exacerbation -check chest x-ray today  Plan  Patient Instructions  Chest x-ray today, I will call with results. Levaquin 500mg  daily for 1 week.  Take with food.  Mucinex DM twice daily as needed for cough congestion Use flutter valve at least 3 times daily Fluids and rest Tylenol as needed for fever Follow-up with Dr. Melvyn Novas in 4 to 6 weeks and as needed Please contact office for sooner follow up if symptoms do not improve or worsen or seek emergency care          Rexene Edison, NP 06/19/2017

## 2017-06-19 NOTE — Assessment & Plan Note (Signed)
Exacerbation -check chest x-ray today  Plan  Patient Instructions  Chest x-ray today, I will call with results. Levaquin 500mg  daily for 1 week.  Take with food.  Mucinex DM twice daily as needed for cough congestion Use flutter valve at least 3 times daily Fluids and rest Tylenol as needed for fever Follow-up with Dr. Melvyn Novas in 4 to 6 weeks and as needed Please contact office for sooner follow up if symptoms do not improve or worsen or seek emergency care

## 2017-06-19 NOTE — Addendum Note (Signed)
Addended by: Parke Poisson E on: 06/19/2017 10:54 AM   Modules accepted: Orders

## 2017-06-20 ENCOUNTER — Telehealth: Payer: Self-pay | Admitting: Adult Health

## 2017-06-20 MED ORDER — ACAPELLA MISC: 0 refills | Status: AC

## 2017-06-20 NOTE — Telephone Encounter (Signed)
Spoke with pt's husband. Pt is needing a new flutter valve. Flutter valve has been left up front for pick up. Nothing further was needed.

## 2017-06-25 ENCOUNTER — Telehealth: Payer: Self-pay | Admitting: Adult Health

## 2017-06-25 MED ORDER — LEVOFLOXACIN 500 MG PO TABS: 500.0000 mg | ORAL_TABLET | Freq: Every day | ORAL | 0 refills | Status: DC

## 2017-06-25 NOTE — Telephone Encounter (Signed)
Oh goodness, is she okay from hitting her head, sounds like she needs to be checked out from fall and head injury .   Can send 2 additional Levaquin tabs - Levaquin 500mg  daily #2 , no refills.   Recommend she get checked out from fall , head injury and vomiting .   Please contact office for sooner follow up if symptoms do not improve or worsen or seek emergency care

## 2017-06-25 NOTE — Telephone Encounter (Signed)
Pt aware of recs.  Pt is seeing her PCP tomorrow, and also notes that her husband is a MD and has evaluated her.  rx sent to pharmacy.  Nothing further needed.

## 2017-06-25 NOTE — Telephone Encounter (Signed)
Spoke with pt, states that she leaned over the bed this morning and fell out of bed- hit her head on the corner of a chest next to her bed.  After falling, she vomited breakfast and her levaquin.  Approximate time between taking medication and levaquin- 5 minutes.  Pt also notes that she dropped one other dose down the sink accidentally.  Pt states she is improved since 5/22 visit but still fatigued, sob.  Pt is requesting 2 additional Levaquin tabs to complete her prescribed course.   Pt uses [facility] on [address].   TP please advise if ok to order.  Thanks!

## 2017-07-22 ENCOUNTER — Ambulatory Visit: Payer: Medicare Other | Admitting: Internal Medicine

## 2017-07-22 ENCOUNTER — Encounter: Payer: Self-pay | Admitting: Internal Medicine

## 2017-07-22 VITALS — BP 122/72 | HR 90 | Ht 69.0 in | Wt 129.8 lb

## 2017-07-22 DIAGNOSIS — J471 Bronchiectasis with (acute) exacerbation: Secondary | ICD-10-CM

## 2017-07-22 MED ORDER — LEVOFLOXACIN 500 MG PO TABS: 500.0000 mg | ORAL_TABLET | Freq: Every day | ORAL | 11 refills | Status: DC

## 2017-07-22 MED ORDER — LEVOFLOXACIN 500 MG PO TABS: 500.0000 mg | ORAL_TABLET | Freq: Every day | ORAL | 0 refills | Status: DC

## 2017-07-22 NOTE — Progress Notes (Signed)
Subjective:     Patient ID: Jaime Fitzpatrick, female   DOB: [birth year]    MRN: [id number]    Brief patient profile:  77 yowf never regular smoker with Bronchiectasis/ab after ARDS age 77 from Pneumoccal pna/sepsis with min residual restrictive/obst changes on pfts 09/10/14     History of Present Illness   11/09/2015  f/u ov/Jaime Fitzpatrick re: bronchiectasis maint rx symb 80 2bid/ rare saba  Chief Complaint  Patient presents with  . Follow-up    Pt states that her breathing and cough are unchanged. Her cough is prod with green sputum.    no recent hemoptysis or need for abx / Not limited by breathing from desired activities   No obvious daytime variabilty or assoc   cp or chest tightness, subjective wheeze overt sinus or hb symptoms. No unusual exp hx or h/o childhood pna/ asthma or premature birth to his knowledge.  rec Plan A = Automatic = Symbicort 80 Take 2 puffs first thing in am and then another 2 puffs about 12 hours later.  Work on maintaining  inhaler technique:  Plan B = Backup Only use your albuterol(proair)     07/19/2016 acute extended ov/Jaime Fitzpatrick re: cough plus sob on symb 80 2bid has saba/ not using  Chief Complaint  Patient presents with  . Acute Visit    Pt c/o increased SOB for the past wk. She is not coughing more than usual, and is producing some green sputum.  She states she is having SOB with walking from room to room "yesterday I could not walk 10 steps"- some better today.   only used levaquin once in jan 2018  once in May for uti About 7 days after last flight started worse nasal congestion then cough more green mucus then sob  Breathing much better today vs yesterday, mucus now clear Generalized upper abd pain with coughing "like a pulled muscle"  Overall trending better at end of levquin  rec For cough / congestion > mucinex  1200 mg every 12 hours and use the flutter valve as much as possible  Try prilosec otc 20mg   Take 30-60 min before first meal of the day and Pepcid  ac (famotidine) 20 mg one @  bedtime until cough is completely gone for at least a week without the need for cough suppression GERD (REFLUX)  Prednisone 10 mg take  4 each am x 2 days,   2 each am x 2 days,  1 each am x 2 days and stop  Work on inhaler technique:  re   If breathing gets worse again will need to go to ER for CTangiogram of chest     NP eval 06/19/17  Chest x-ray today,  Levaquin 500mg  daily for 1 week.  Take with food.  Mucinex DM twice daily as needed for cough congestion Use flutter valve at least 3 times daily Fluids and rest Tylenol as needed for fever Follow-up with Dr. Melvyn Novas in 4 to 6 weeks and as needed    07/22/2017  f/u ov/Jaime Fitzpatrick re: f/u p viral uri / bronchiectasis  Chief Complaint  Patient presents with  . Follow-up    Breathing has improved and she is coughing less since the last visit.   Dyspnea:  Just doing  stationary bike building up 20 min on way to baseline 45 min  Cough: late afternoon worst mucus production> light green  SABA use: symb 80 2bid  02: no    No obvious day to day or daytime  variability or assoc   mucus plugs or hemoptysis or cp or chest tightness, subjective wheeze or overt sinus or hb symptoms.   Sleeping: ok p ambien without nocturnal  or early am exacerbation  of respiratory  c/o's or need for noct saba. Also denies any obvious fluctuation of symptoms with weather or environmental changes or other aggravating or alleviating factors except as outlined above   No unusual exposure hx or h/o childhood pna/ asthma or knowledge of premature birth.  Current Allergies, Complete Past Medical History, Past Surgical History, Family History, and Social History were reviewed in [employer] record.  ROS  The following are not active complaints unless bolded Hoarseness, sore throat, dysphagia, dental problems, itching, sneezing,  nasal congestion or discharge of excess mucus or purulent secretions, ear ache,   fever,  chills, sweats, unintended wt loss or wt gain, classically pleuritic or exertional cp,  orthopnea pnd or arm/hand swelling  or leg swelling, presyncope, palpitations, abdominal pain, anorexia, nausea, vomiting, diarrhea  or change in bowel habits or change in bladder habits, change in stools or change in urine, dysuria, hematuria,  rash, arthralgias, visual complaints, headache, numbness, weakness or ataxia or problems with walking or coordination,  change in mood or  memory.        Current Meds  Medication Sig  . albuterol (PROAIR HFA) 108 (90 Base) MCG/ACT inhaler Inhale 2 puffs into the lungs every 4 (four) hours as needed for wheezing or shortness of breath.  . Ascorbic Acid (VITAMIN C PO) Take by mouth daily.    Marland Kitchen b complex vitamins tablet Take 1 tablet by mouth daily.    . budesonide-formoterol (SYMBICORT) 80-4.5 MCG/ACT inhaler inhale 2 puffs by mouth FIRST THING IN THE MORNING AND ANOTHER 2 PUFFS ABOUT 12 HOURS LATER  . Cholecalciferol (VITAMIN D PO) Take 1 tablet by mouth daily.  . Coenzyme Q10 (COQ10 PO) Take by mouth daily.    . Glucosamine-Chondroit-Vit C-Mn (GLUCOSAMINE 1500 COMPLEX PO) Take 1 tablet by mouth 2 (two) times daily.  Marland Kitchen guaiFENesin (MUCINEX) 600 MG 12 hr tablet Take 1,200 mg by mouth 2 (two) times daily.    . Misc. Devices (ACAPELLA) MISC Use as directed  . Multiple Vitamins-Calcium (MULTI-DAY/CALCIUM/EXTRA IRON) TABS Take 1 tablet by mouth daily.    . Probiotic Product (PROBIOTIC PO) Take 1 capsule by mouth daily.  Marland Kitchen Respiratory Therapy Supplies (FLUTTER) DEVI Use as directed  . telmisartan (MICARDIS) 80 MG tablet Take 80 mg by mouth daily.    Marland Kitchen triamcinolone (NASACORT) 55 MCG/ACT AERO nasal inhaler Place 1 spray into the nose daily.  Marland Kitchen zolpidem (AMBIEN) 5 MG tablet Take 1-2 tablets (5-10 mg total) by mouth at bedtime as needed for sleep.                          Past Medical History:  PNEUMOCOCCAL PNEUMONIA (ICD-481)  BRONCHIECTASIS (ICD-494.0)  ADULT  RESPIRATORY DISTRESS SYNDROME (ICD-518.82)  Health Maintenance.................................................................Marland KitchenMarland KitchenAvva  - Pneumovax 12/2008  prevnar 12/13/13    Family History:  Heart dz- Father  Allergies- Father    Social History:  Married  Agricultural engineer  Former smoker. Smoked approx 1 yr socailly in 1968.  ETOH socially        Objective:   Physical Exam     07/20/2014        137 > 09/10/2014  136 >  11/09/2015  138 > 07/19/2016  131 > 07/22/2017  129     09/18/12  135 lb (61.236 kg)  03/18/12 141 lb 6.4 oz (64.139 kg)  02/26/12 139 lb 3.2 oz (63.141 kg)      Vital signs reviewed - Note on arrival 02 sats  100% on RA      HEENT: nl dentition, turbinates bilaterally, and oropharynx. Nl external ear canals without cough reflex   NECK :  without JVD/Nodes/TM/ nl carotid upstrokes bilaterally   LUNGS: no acc muscle use,  Nl contour chest with minimal insp/exp rhonchi  Bilaterally L>R  without cough on insp or exp maneuvers   CV:  RRR  no s3 or murmur or increase in P2, and no edema   ABD:  soft and nontender with nl inspiratory excursion in the supine position. No bruits or organomegaly appreciated, bowel sounds nl  MS:  Nl gait/ ext warm without deformities, calf tenderness, cyanosis or clubbing No obvious joint restrictions   SKIN: warm and dry without lesions    NEURO:  alert, approp, nl sensorium with  no motor or cerebellar deficits apparent.             Assessment:

## 2017-07-22 NOTE — Patient Instructions (Signed)
No change in medications   For nastier than normal mucus > levaquin 500 mg one daily x 7 days   Please schedule a follow up visit in 12 months but call sooner if needed

## 2017-07-24 ENCOUNTER — Encounter: Payer: Self-pay | Admitting: Internal Medicine

## 2017-07-24 NOTE — Assessment & Plan Note (Addendum)
07/22/2017 rec Levaquin 500 mg qd x 7 days prn flare    She is back to baseline now s/p flare controlled with levaquin >>> Reviewed strategy to control recurrent flares using levaquin and in meantime continue maint rx with symb 80 2bid/ flutter and CPPD per husband daily   - The proper method of use, as well as anticipated side effects, of a metered-dose inhaler are discussed and demonstrated to the patient.   I had an extended discussion with the patient reviewing all relevant studies completed to date and  lasting 15 to 20 minutes of a 25 minute visit    See device teaching which extended face to face time for this visit.  Each maintenance medication was reviewed in detail including emphasizing most importantly the difference between maintenance and prns and under what circumstances the prns are to be triggered using an action plan format that is not reflected in the computer generated alphabetically organized AVS which I have not found useful in most complex patients, especially with respiratory illnesses  Please see AVS for specific instructions unique to this visit that I personally wrote and verbalized to the the pt in detail and then reviewed with pt  by my nurse highlighting any  changes in therapy recommended at today's visit to their plan of care.     pulmoanary f/u can be yearly but certainly prn if above strategy fails to control flares

## 2017-08-04 ENCOUNTER — Other Ambulatory Visit: Payer: Self-pay | Admitting: Internal Medicine

## 2017-08-04 DIAGNOSIS — J479 Bronchiectasis, uncomplicated: Secondary | ICD-10-CM

## 2018-01-01 ENCOUNTER — Ambulatory Visit (HOSPITAL_COMMUNITY)
Admission: RE | Admit: 2018-01-01 | Discharge: 2018-01-01 | Disposition: A | Discharge status: Home / Self Care | Payer: Medicare Other | Source: Ambulatory Visit | Attending: Internal Medicine | Admitting: Internal Medicine

## 2018-01-01 ENCOUNTER — Encounter (HOSPITAL_COMMUNITY): Payer: Self-pay

## 2018-01-01 DIAGNOSIS — M810 Age-related osteoporosis without current pathological fracture: Secondary | ICD-10-CM | POA: Diagnosis present

## 2018-01-01 MED ORDER — ZOLEDRONIC ACID 5 MG/100ML IV SOLN
5.0000 mg | Freq: Once | INTRAVENOUS | Status: AC
  Administered 2018-01-01: 5 mg via INTRAVENOUS
  Filled 2018-01-01: qty 100

## 2018-01-01 MED ORDER — SODIUM CHLORIDE 0.9 % IV SOLN
Freq: Once | INTRAVENOUS | Status: AC
  Administered 2018-01-01: 13:00:00 via INTRAVENOUS

## 2018-01-01 NOTE — Discharge Instructions (Signed)

## 2018-07-29 ENCOUNTER — Encounter: Payer: Self-pay | Admitting: *Deleted

## 2018-07-29 ENCOUNTER — Telehealth: Payer: Self-pay | Admitting: Internal Medicine

## 2018-07-29 NOTE — Telephone Encounter (Signed)
Letter done and mailed per Dr Melvyn Novas

## 2018-07-29 NOTE — Telephone Encounter (Signed)
Send letter:   Ms Jaime Fitzpatrick is a longstanding patient of  pulmonary and suffers from bronchiectasis as a result of severe prior lif threatening  Pneumonia requiring prolonged life support.   As a result, she depends on a continuous supply of electricity to run her medical equipment which consists of both nebulizer for delivery of medications and VEST which is an electrically  Powdered form of chest percussion to help her mobilize secretions.   I advised her to install an adequate generator for backup 120 volt supply to assure she does not go without these important devices.   Christinia Gully, MD Pulmonary and [place] [phone] After 5:30 PM or weekends, use [facility] [phone]

## 2018-08-26 ENCOUNTER — Other Ambulatory Visit: Payer: Self-pay | Admitting: Internal Medicine

## 2018-08-26 DIAGNOSIS — J479 Bronchiectasis, uncomplicated: Secondary | ICD-10-CM

## 2018-12-16 ENCOUNTER — Other Ambulatory Visit: Payer: Self-pay

## 2018-12-16 DIAGNOSIS — Z20822 Contact with and (suspected) exposure to covid-19: Secondary | ICD-10-CM

## 2018-12-18 LAB — NOVEL CORONAVIRUS, NAA: SARS-CoV-2, NAA: NOT DETECTED

## 2019-01-05 ENCOUNTER — Other Ambulatory Visit: Payer: Self-pay

## 2019-01-05 ENCOUNTER — Ambulatory Visit (HOSPITAL_COMMUNITY)
Admission: RE | Admit: 2019-01-05 | Discharge: 2019-01-05 | Disposition: A | Discharge status: Home / Self Care | Payer: Medicare Other | Source: Ambulatory Visit | Attending: Internal Medicine | Admitting: Internal Medicine

## 2019-01-05 ENCOUNTER — Encounter (HOSPITAL_COMMUNITY): Payer: Self-pay

## 2019-01-05 DIAGNOSIS — M810 Age-related osteoporosis without current pathological fracture: Secondary | ICD-10-CM | POA: Diagnosis not present

## 2019-01-05 MED ORDER — SODIUM CHLORIDE 0.9 % IV SOLN
Freq: Once | INTRAVENOUS | Status: AC
  Administered 2019-01-05: 12:00:00 via INTRAVENOUS

## 2019-01-05 MED ORDER — ZOLEDRONIC ACID 5 MG/100ML IV SOLN
5.0000 mg | Freq: Once | INTRAVENOUS | Status: AC
  Administered 2019-01-05: 5 mg via INTRAVENOUS

## 2019-01-05 MED ORDER — ZOLEDRONIC ACID 5 MG/100ML IV SOLN
INTRAVENOUS | Status: AC
  Filled 2019-01-05: qty 100

## 2019-01-05 NOTE — Discharge Instructions (Signed)

## 2019-02-17 ENCOUNTER — Ambulatory Visit: Payer: Medicare Other | Attending: Internal Medicine

## 2019-02-17 DIAGNOSIS — Z23 Encounter for immunization: Secondary | ICD-10-CM | POA: Insufficient documentation

## 2019-02-17 NOTE — Progress Notes (Signed)
   Covid-19 Vaccination Clinic  Name:  Jaime Fitzpatrick    MRN: [id number] DOB: 1940/11/30  02/17/2019  [name] was observed post Covid-19 immunization for 15 minutes without incidence. She was provided with Vaccine Information Sheet and instruction to access the V-Safe system.   [name] was instructed to call 911 with any severe reactions post vaccine: Marland Kitchen Difficulty breathing  . Swelling of your face and throat  . A fast heartbeat  . A bad rash all over your body  . Dizziness and weakness    Immunizations Administered    Name Date Dose VIS Date Route   Pfizer COVID-19 Vaccine 02/17/2019  3:05 PM 0.3 mL 01/09/2019 Intramuscular   Manufacturer: [place]   Lot: [id number]   [place]: [id number]

## 2019-03-09 ENCOUNTER — Ambulatory Visit: Payer: Medicare Other | Attending: Internal Medicine

## 2019-03-09 DIAGNOSIS — Z23 Encounter for immunization: Secondary | ICD-10-CM

## 2019-03-09 NOTE — Progress Notes (Signed)
   Covid-19 Vaccination Clinic  Name:  ALEXIANA MORRISSETTE    MRN: [id number] DOB: Apr 16, 1940  03/09/2019  Ms. Bullen was observed post Covid-19 immunization for 15 minutes without incidence. She was provided with Vaccine Information Sheet and instruction to access the V-Safe system.   Ms. Baal was instructed to call 911 with any severe reactions post vaccine: Marland Kitchen Difficulty breathing  . Swelling of your face and throat  . A fast heartbeat  . A bad rash all over your body  . Dizziness and weakness    Immunizations Administered    Name Date Dose VIS Date Route   Pfizer COVID-19 Vaccine 03/09/2019  4:44 PM 0.3 mL 01/09/2019 Intramuscular   Manufacturer: [employer]   Lot: [id number]   [place]: [id number]

## 2019-04-23 ENCOUNTER — Other Ambulatory Visit: Payer: Self-pay | Admitting: Internal Medicine

## 2019-04-23 DIAGNOSIS — J479 Bronchiectasis, uncomplicated: Secondary | ICD-10-CM

## 2019-04-27 ENCOUNTER — Other Ambulatory Visit: Payer: Self-pay | Admitting: *Deleted

## 2019-04-27 DIAGNOSIS — J479 Bronchiectasis, uncomplicated: Secondary | ICD-10-CM

## 2019-05-04 ENCOUNTER — Telehealth: Payer: Self-pay | Admitting: Internal Medicine

## 2019-05-04 DIAGNOSIS — J479 Bronchiectasis, uncomplicated: Secondary | ICD-10-CM

## 2019-05-04 NOTE — Telephone Encounter (Signed)
Needs appt- last seen in 2019  [facility]

## 2019-05-05 MED ORDER — BUDESONIDE-FORMOTEROL FUMARATE 80-4.5 MCG/ACT IN AERO: INHALATION_SPRAY | RESPIRATORY_TRACT | 5 refills | Status: DC

## 2019-05-05 NOTE — Telephone Encounter (Signed)
Called and spoke to pt. Pt has already made an appt with APP on 4/12. Symbicort sent to preferred pharmacy and informed pt to be sure to keep upcoming appt. Pt verbalized understanding and denied any further questions or concerns at this time.

## 2019-05-11 ENCOUNTER — Other Ambulatory Visit: Payer: Self-pay

## 2019-05-11 ENCOUNTER — Ambulatory Visit: Payer: Medicare Other | Admitting: Primary Care

## 2019-05-11 ENCOUNTER — Encounter: Payer: Self-pay | Admitting: Primary Care

## 2019-05-11 VITALS — BP 130/74 | HR 87 | Temp 98.5°F | Ht 69.0 in | Wt 132.6 lb

## 2019-05-11 DIAGNOSIS — J479 Bronchiectasis, uncomplicated: Secondary | ICD-10-CM

## 2019-05-11 MED ORDER — ALBUTEROL SULFATE HFA 108 (90 BASE) MCG/ACT IN AERS: 2.0000 | INHALATION_SPRAY | RESPIRATORY_TRACT | 1 refills | Status: DC | PRN

## 2019-05-11 NOTE — Progress Notes (Signed)
@Patient  ID: Jaime Fitzpatrick, female    DOB: 1940-04-01, 79 y.o.   MRN: [id number]  Chief Complaint  Patient presents with  . Follow-up    yearly     Referring provider: Prince Solian, MD  HPI: Patient is a 79 yo female, former smoker. 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 Patient of  Dr. Melvyn Novas last seen in office 07/22/2017 by Dr. Melvyn Novas. Her Bronchiectasis is maintained with Sybmicort 80 mcg 2 puffs BID and rarely requires albuterol inhaler usage. Patient was experiencing increased coughing (green sputum) and sob at this office visit. Patient was given mucinex 600 mg q12 and flutter valve as much as possible for cough. Prilosec 20 mg daily and pepcid 20 mg before bedtime was also started to see if coughing symptoms improve if related to GERD. Prednisone taper for 6 days also started to see if coughing symptoms improve   Imaging: 06/19/2017 CXR: Cardiac shadow is stable. Aortic calcifications are again seen. Chronic scarring is noted in the apices bilaterally left greater than right. Patchy scarring is noted throughout both lungs stable from the previous exam. No acute infiltrate is seen. No sizable effusion is noted. Bilateral breast implants are noted. No bony abnormality is seen.  PFT: 09/10/2014: The FVC, FEV1, FEV1/FVC ratio and FEF25-75% are minimally reduced indicating airway obstruction. The lung volumes are reduced. Following administration of bronchodilators, there is no significant response. The diffusing capacity is normal. However, the diffusing capacity was not corrected for the patient's hemoglobin.  05/11/2019  Patient here for yearly follow up visit.  She has history of Bronchiectasis and is taking Symbicort 80 mcg inhaler prescription refilled. She has not used her Albuterol inhaler in several years and believes it is expired. She states her breathing is at baseline and has no complaints. She has dyspnea with exertion  but it is at her baseline.   She has a chronic cough and sputum is not abnormal at this time. She occasionally coughs up green thick sputum but this is her normal. Has not had a flare and needed Levaquin in over a year. She uses flutter as needed to help clear up her secretions and Mucinex BID. She states that she hasn't used her flutter valve in weeks.  Patient denies wheezing, sob at rest, allergies, chest pain.  Patient has received both Covid vaccinations.  Allergies  Allergen Reactions  . Acetaminophen-Codeine     REACTION: unspecified  . Amoxicillin     REACTION: unspecified  . Avelox [Moxifloxacin Hcl In Nacl]     hives  . Erythromycin Ethylsuccinate     REACTION: unspecified    Immunization History  Administered Date(s) Administered  . DTP 01/30/2003  . H1N1 01/20/2008  . Influenza Split 09/30/2011  . Influenza Whole 09/30/2015  . Influenza, High Dose Seasonal PF 10/29/2016  . Influenza,inj,Quad PF,6+ Mos 10/29/2013  . PFIZER SARS-COV-2 Vaccination 02/17/2019, 03/09/2019  . Pneumococcal Conjugate-13 12/13/2013  . Pneumococcal Polysaccharide-23 01/20/2009    Past Medical History:  Diagnosis Date  . ARDS (adult respiratory distress syndrome) ([place])   . Bronchiectasis   . Hypertension   . 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 Nephrolithiasis   . Palpitations   . Pneumococcal pneumonia ([place])   . Reflux   . Shortness of breath dyspnea    going up stairs    Tobacco History: Social History   Tobacco Use  Smoking Status Former Smoker  . Packs/day: 0.10  . Years: 1.00  . Pack  years: 0.10  . Types: Cigarettes  . Quit date: 01/29/1966  . Years since quitting: 53.3  Smokeless Tobacco Never Used  Tobacco Comment   only smoked 1 year socially.    Counseling given: Not Answered Comment: only smoked 1 year socially.    Outpatient Medications Prior to Visit  Medication Sig Dispense Refill  . Ascorbic Acid (VITAMIN C PO) Take by mouth daily.      Marland Kitchen b  complex vitamins tablet Take 1 tablet by mouth daily.      . budesonide-formoterol (SYMBICORT) 80-4.5 MCG/ACT inhaler INHALE 2 PUFFS BY MOUTH EVERY MORNING THEN 2 PUFFS 12 HOURS LATER 10.2 g 5  . Cholecalciferol (VITAMIN D PO) Take 1 tablet by mouth daily.    . Coenzyme Q10 (COQ10 PO) Take by mouth daily.      . Glucosamine-Chondroit-Vit C-Mn (GLUCOSAMINE 1500 COMPLEX PO) Take 1 tablet by mouth 2 (two) times daily.    Marland Kitchen guaiFENesin (MUCINEX) 600 MG 12 hr tablet Take 1,200 mg by mouth 2 (two) times daily.      Marland Kitchen levofloxacin (LEVAQUIN) 500 MG tablet Take 1 tablet (500 mg total) by mouth daily. 7 tablet 11  . methenamine (MANDELAMINE) 1 g tablet Take 1,000 mg by mouth 2 (two) times daily.    . Misc. Devices (ACAPELLA) MISC Use as directed 1 each 0  . Multiple Vitamins-Calcium (MULTI-DAY/CALCIUM/EXTRA IRON) TABS Take 1 tablet by mouth daily.      . Probiotic Product (PROBIOTIC PO) Take 1 capsule by mouth daily.    Marland Kitchen Respiratory Therapy Supplies (FLUTTER) DEVI Use as directed 1 each 0  . telmisartan (MICARDIS) 80 MG tablet Take 80 mg by mouth daily.      Marland Kitchen triamcinolone (NASACORT) 55 MCG/ACT AERO nasal inhaler Place 1 spray into the nose daily.    Marland Kitchen zolpidem (AMBIEN) 5 MG tablet Take 1-2 tablets (5-10 mg total) by mouth at bedtime as needed for sleep. 16 tablet 0  . albuterol (PROAIR HFA) 108 (90 Base) MCG/ACT inhaler Inhale 2 puffs into the lungs every 4 (four) hours as needed for wheezing or shortness of breath. 1 Inhaler 1   No facility-administered medications prior to visit.      Review of Systems  Review of Systems  Constitutional: Negative for chills, fatigue, fever and unexpected weight change.  HENT: Negative for congestion, postnasal drip, sinus pressure, sinus pain and sneezing.   Eyes: Negative for itching.  Respiratory: Positive for cough. Negative for apnea, chest tightness, shortness of breath and wheezing.        Productive green sputum   Cardiovascular: Negative for chest  pain, palpitations and leg swelling.  Allergic/Immunologic: Negative for environmental allergies.     Physical Exam  BP 130/74 (BP Location: Left Arm, Cuff Size: Normal)   Pulse 87   Temp 98.5 F (36.9 C) (Temporal)   Ht 5\' 9"  (1.753 m)   Wt 60.1 kg   SpO2 93%   BMI 19.58 kg/m  Physical Exam Constitutional:      Appearance: Normal appearance.  HENT:     Head: Normocephalic.     Mouth/Throat:     Mouth: Mucous membranes are moist.     Comments: Mallampatie Score of 2 Cardiovascular:     Rate and Rhythm: Normal rate and regular rhythm.  Pulmonary:     Effort: Pulmonary effort is normal.     Breath sounds: Wheezing and rhonchi present. No rales.     Comments: Expiratory wheeze and Rhonchi bilaterally upper and middle lobes.  Dimished bases. Skin:    General: Skin is warm.  Neurological:     Mental Status: She is alert.  Psychiatric:        Mood and Affect: Mood normal.        Behavior: Behavior normal.      Lab Results:  CBC    Component Value Date/Time   HGB 13.2 08/05/2014 0708    BMET No results found for: NA, K, CL, CO2, GLUCOSE, BUN, CREATININE, CALCIUM, GFRNONAA, GFRAA  BNP No results found for: BNP  ProBNP No results found for: PROBNP  Imaging: No results found.   Assessment & Plan:   BRONCHIECTASIS -Continue Symbicort 80 mcg 2 puffs BID (rinse after use) -Continue Albuterol 108 mcg 2 puffs Q4 prn for wheezing and shortness of breath  -encouraged patient to use albuterol  more due to wheezing heard  today on  physical exam. -Continue Mucinex 600 mg BID and Flutter Valve BID and prn -Notify office if worsening secretions and needs Levaquin medication. Or if patient takes Levaquin that she has at home, please notify office to make [place] aware of her having a flare of symptoms. -Follow up with Dr. Melvyn Novas in one year     Andres Labrum, [facility] 05/11/2019

## 2019-05-11 NOTE — Patient Instructions (Addendum)
Continue Symbicort 80 mcg 2 puffs two times a day (rinse after use) Albuterol 108 mcg 2 puffs Q4 prn refill at patients request for wheezing and shortness of breath. Continue Mucinex 600 mg BID and Flutter Valve BID and as needed.  Follow up with Dr. Melvyn Novas in one year  Bronchiectasis  Bronchiectasis is a condition in which the airways in the lungs (bronchi) are damaged and widened. The condition makes it hard for the lungs to get rid of mucus, and it causes mucus to gather in the bronchi. This condition often leads to lung infections, which can make the condition worse. What are the causes? You can be born with this condition or you can develop it later in life. Common causes of this condition include:  Cystic fibrosis.  Repeated lung infections, such as pneumonia or tuberculosis.  An object or other blockage in the lungs.  Breathing in fluid, food, or other objects (aspiration).  A problem with the immune system and lung structure that is present at birth (congenital). Sometimes the cause is not known. What are the signs or symptoms? Common symptoms of this condition include:  A daily cough that brings up mucus and lasts for more than 3 weeks.  Lung infections that happen often.  Shortness of breath and wheezing.  Weakness and fatigue. How is this diagnosed? This condition is diagnosed with tests, such as:  Chest X-rays or CT scans. These are done to check for changes in the lungs.  Breathing tests. These are done to check how well your lungs are working.  A test of a sample of your saliva (sputum culture). This test is done to check for infection.  Blood tests and other tests. These are done to check for related diseases or causes. How is this treated? Treatment for this condition depends on the severity of the illness and its cause. Treatment may include:  Medicines that loosen mucus so it can be coughed up (expectorants).  Medicines that relax the muscles of the  bronchi (bronchodilators).  Antibiotic medicines to prevent or treat infection.  Physical therapy to help clear mucus from the lungs. Techniques may include: ? Postural drainage. This is when you sit or lie in certain positions so that mucus can drain by gravity. ? Chest percussion. This involves tapping the chest or back with a cupped hand. ? Chest vibration. For this therapy, a hand or special equipment vibrates your chest and back.  Surgery to remove the affected part of the lung. This may be done in severe cases. Follow these instructions at home: Medicines  Take over-the-counter and prescription medicines only as told by your health care provider.  If you were prescribed an antibiotic medicine, take it as told by your health care provider. Do not stop taking the antibiotic even if you start to feel better.  Avoid taking sedatives and antihistamines unless your health care provider tells you to take them. These medicines tend to thicken the mucus in the lungs. Managing symptoms  Perform breathing exercises or techniques to clear your lungs as told by your health care provider.  Consider using a cold steam vaporizer or humidifier in your room or home to help loosen secretions.  If you have a cough that gets worse at night, try sleeping in a semi-upright position. General instructions  Get plenty of rest.  Drink enough fluid to keep your urine clear or pale yellow.  Stay inside when pollution and ozone levels are high.  Stay up to date with vaccinations  and immunizations.  Avoid cigarette smoke and other lung irritants.  Do not use any products that contain nicotine or tobacco, such as cigarettes and e-cigarettes. If you need help quitting, ask your health care provider.  Keep all follow-up visits as told by your health care provider. This is important. Contact a health care provider if:  You cough up more sputum than before and the sputum is yellow or green in  color.  You have a fever.  You cannot control your cough and are losing sleep. Get help right away if:  You cough up blood.  You have chest pain.  You have increasing shortness of breath.  You have pain that gets worse or is not controlled with medicines.  You have a fever and your symptoms suddenly get worse. Summary  Bronchiectasis is a condition in which the airways in the lungs (bronchi) are damaged and widened. The condition makes it hard for the lungs to get rid of mucus, and it causes mucus to gather in the bronchi.  Treatment usually includes therapy to help clear mucus from the lungs.  Stay up to date with vaccinations and immunizations. This information is not intended to replace advice given to you by your health care provider. Make sure you discuss any questions you have with your health care provider. Document Revised: 12/28/2016 Document Reviewed: 02/20/2016 Elsevier Patient Education  2020 [employer].

## 2019-05-11 NOTE — Assessment & Plan Note (Addendum)
-   Stable interval. Cough is productive with green sputum but this is her baseline and amount has not increased. She has not required on hand Levaquin in the last year.  - Continue Symbicort 80 mcg 2 puffs BID (rinse after use) - Continue Albuterol 108 mcg 2 puffs Q4 prn for wheezing and shortness of breath - Continue Mucinex 600 mg BID and Flutter Valve BID and prn - Notify office if worsening purulent secretions, please notify office to make [place] aware of her having a flare of symptoms. - Up to date with covid vaccine - Follow up with Dr. Melvyn Novas in one year

## 2019-11-13 ENCOUNTER — Other Ambulatory Visit: Payer: Self-pay | Admitting: Internal Medicine

## 2019-11-13 DIAGNOSIS — J479 Bronchiectasis, uncomplicated: Secondary | ICD-10-CM

## 2019-11-23 ENCOUNTER — Other Ambulatory Visit: Payer: Self-pay | Admitting: Internal Medicine

## 2019-11-23 DIAGNOSIS — J479 Bronchiectasis, uncomplicated: Secondary | ICD-10-CM

## 2019-11-23 MED ORDER — BUDESONIDE-FORMOTEROL FUMARATE 80-4.5 MCG/ACT IN AERO: INHALATION_SPRAY | RESPIRATORY_TRACT | 5 refills | Status: DC

## 2020-01-12 ENCOUNTER — Other Ambulatory Visit (HOSPITAL_COMMUNITY): Payer: Self-pay | Admitting: *Deleted

## 2020-01-13 ENCOUNTER — Other Ambulatory Visit: Payer: Self-pay

## 2020-01-13 ENCOUNTER — Ambulatory Visit (HOSPITAL_COMMUNITY)
Admission: RE | Admit: 2020-01-13 | Discharge: 2020-01-13 | Disposition: A | Discharge status: Home / Self Care | Payer: Medicare Other | Source: Ambulatory Visit | Attending: Internal Medicine | Admitting: Internal Medicine

## 2020-01-13 DIAGNOSIS — M810 Age-related osteoporosis without current pathological fracture: Secondary | ICD-10-CM | POA: Insufficient documentation

## 2020-01-13 MED ORDER — ZOLEDRONIC ACID 5 MG/100ML IV SOLN
5.0000 mg | Freq: Once | INTRAVENOUS | Status: AC
  Administered 2020-01-13: 5 mg via INTRAVENOUS

## 2020-01-13 MED ORDER — ZOLEDRONIC ACID 5 MG/100ML IV SOLN
INTRAVENOUS | Status: AC
  Filled 2020-01-13: qty 100

## 2020-06-14 ENCOUNTER — Other Ambulatory Visit: Payer: Self-pay | Admitting: Internal Medicine

## 2020-06-14 DIAGNOSIS — J479 Bronchiectasis, uncomplicated: Secondary | ICD-10-CM

## 2020-06-24 ENCOUNTER — Other Ambulatory Visit: Payer: Self-pay | Admitting: Internal Medicine

## 2020-06-24 DIAGNOSIS — J479 Bronchiectasis, uncomplicated: Secondary | ICD-10-CM

## 2020-07-04 ENCOUNTER — Ambulatory Visit: Payer: Medicare Other | Admitting: Internal Medicine

## 2020-07-04 ENCOUNTER — Other Ambulatory Visit: Payer: Self-pay

## 2020-07-04 ENCOUNTER — Encounter: Payer: Self-pay | Admitting: Internal Medicine

## 2020-07-04 DIAGNOSIS — J479 Bronchiectasis, uncomplicated: Secondary | ICD-10-CM | POA: Diagnosis not present

## 2020-07-04 MED ORDER — BUDESONIDE-FORMOTEROL FUMARATE 80-4.5 MCG/ACT IN AERO: INHALATION_SPRAY | RESPIRATORY_TRACT | 11 refills | Status: AC

## 2020-07-04 MED ORDER — ALBUTEROL SULFATE HFA 108 (90 BASE) MCG/ACT IN AERS: 2.0000 | INHALATION_SPRAY | Freq: Four times a day (QID) | RESPIRATORY_TRACT | 1 refills | Status: DC | PRN

## 2020-07-04 NOTE — Patient Instructions (Signed)
No change in medications   Please schedule a follow up visit in 12  months but call sooner if needed  

## 2020-07-04 NOTE — Progress Notes (Signed)
Subjective:     Patient ID: Jaime Fitzpatrick, female   DOB: Jul 20, 1940    MRN: [id number]    Brief patient profile:  38 yowf never regular smoker with Bronchiectasis/ab after ARDS age 80 from Pneumoccal pna/sepsis with min residual restrictive/obst changes on pfts 09/10/14     History of Present Illness   11/09/2015  f/u ov/Kashif Pooler re: bronchiectasis maint rx symb 80 2bid/ rare saba  Chief Complaint  Patient presents with  . Follow-up    Pt states that her breathing and cough are unchanged. Her cough is prod with green sputum.    no recent hemoptysis or need for abx / Not limited by breathing from desired activities   No obvious daytime variabilty or assoc   cp or chest tightness, subjective wheeze overt sinus or hb symptoms. No unusual exp hx or h/o childhood pna/ asthma or premature birth to his knowledge.  rec Plan A = Automatic = Symbicort 80 Take 2 puffs first thing in am and then another 2 puffs about 12 hours later.  Work on maintaining  inhaler technique:  Plan B = Backup Only use your albuterol(proair)     07/19/2016 acute extended ov/Ahmad Vanwey re: cough plus sob on symb 80 2bid has saba/ not using  Chief Complaint  Patient presents with  . Acute Visit    Pt c/o increased SOB for the past wk. She is not coughing more than usual, and is producing some green sputum.  She states she is having SOB with walking from room to room "yesterday I could not walk 10 steps"- some better today.   only used levaquin once in jan 2018  once in May for uti About 7 days after last flight started worse nasal congestion then cough more green mucus then sob  Breathing much better today vs yesterday, mucus now clear Generalized upper abd pain with coughing "like a pulled muscle"  Overall trending better at end of levquin  rec For cough / congestion > mucinex  1200 mg every 12 hours and use the flutter valve as much as possible  Try prilosec otc 20mg   Take 30-60 min before first meal of the day and Pepcid  ac (famotidine) 20 mg one @  bedtime until cough is completely gone for at least a week without the need for cough suppression GERD (REFLUX)  Prednisone 10 mg take  4 each am x 2 days,   2 each am x 2 days,  1 each am x 2 days and stop  Work on inhaler technique:  re   If breathing gets worse again will need to go to ER for CTangiogram of chest     NP eval 06/19/17  Chest x-ray today,  Levaquin 500mg  daily for 1 week.  Take with food.  Mucinex DM twice daily as needed for cough congestion Use flutter valve at least 3 times daily Fluids and rest Tylenol as needed for fever Follow-up with Dr. Melvyn Novas in 4 to 6 weeks and as needed    07/22/2017  f/u ov/Kenlei Safi re: f/u p viral uri / bronchiectasis  Chief Complaint  Patient presents with  . Follow-up    Breathing has improved and she is coughing less since the last visit.   Dyspnea:  Just doing  stationary bike building up 20 min on way to baseline 45 min  Cough: late afternoon worst mucus production> light green SABA use: symb 80 2bid  02: no    07/04/2020  f/u ov/Danialle Dement re: bronchiectasis  Chief  Complaint  Patient presents with  . Follow-up    Needing refill on symbicort.    Dyspnea: golf twice weekly  Cough: cppd nightly > light yellow production never bloody  Sleeping: flat bed, 3 pillows  SABA use: symbicort 80 2bid  02: none  Covid status:   vax x 4    No obvious day to day or daytime variability or assoc  mucus plugs or hemoptysis or cp or chest tightness, subjective wheeze or overt sinus or hb symptoms.   sleeping without nocturnal  or early am exacerbation  of respiratory  c/o's or need for noct saba. Also denies any obvious fluctuation of symptoms with weather or environmental changes or other aggravating or alleviating factors except as outlined above   No unusual exposure hx or h/o childhood pna/ asthma or knowledge of premature birth.  Current Allergies, Complete Past Medical History, Past Surgical History, Family  History, and Social History were reviewed in [employer] record.  ROS  The following are not active complaints unless bolded Hoarseness, sore throat, dysphagia, dental problems, itching, sneezing,  nasal congestion or discharge of excess mucus or purulent secretions, ear ache,   fever, chills, sweats, unintended wt loss or wt gain, classically pleuritic or exertional cp,  orthopnea pnd or arm/hand swelling  or leg swelling, presyncope, palpitations, abdominal pain, anorexia, nausea, vomiting, diarrhea  or change in bowel habits or change in bladder habits, change in stools or change in urine, dysuria, hematuria,  rash, arthralgias, visual complaints, headache, numbness, weakness or ataxia or problems with walking or coordination,  change in mood or  memory.        Current Meds  Medication Sig  . Ascorbic Acid (VITAMIN C PO) Take by mouth daily.  Marland Kitchen b complex vitamins tablet Take 1 tablet by mouth daily.  . budesonide-formoterol (SYMBICORT) 80-4.5 MCG/ACT inhaler INHALE 2 PUFFS BY MOUTH EVERY MORNING THEN 2 PUFFS 12 HOURS LATER  . Cholecalciferol (VITAMIN D PO) Take 1 tablet by mouth daily.  . Coenzyme Q10 (COQ10 PO) Take by mouth daily.  . Glucosamine-Chondroit-Vit C-Mn (GLUCOSAMINE 1500 COMPLEX PO) Take 1 tablet by mouth 2 (two) times daily.  Marland Kitchen guaiFENesin (MUCINEX) 600 MG 12 hr tablet Take 1,200 mg by mouth 2 (two) times daily.  . methenamine (MANDELAMINE) 1 g tablet Take 1,000 mg by mouth 2 (two) times daily.  . Misc. Devices (ACAPELLA) MISC Use as directed  . Multiple Vitamins-Calcium (MULTI-DAY/CALCIUM/EXTRA IRON) TABS Take 1 tablet by mouth daily.  . Probiotic Product (PROBIOTIC PO) Take 1 capsule by mouth daily.  Marland Kitchen Respiratory Therapy Supplies (FLUTTER) DEVI Use as directed  . telmisartan (MICARDIS) 80 MG tablet Take 80 mg by mouth daily.  Marland Kitchen triamcinolone (NASACORT) 55 MCG/ACT AERO nasal inhaler Place 1 spray into the nose daily.  Marland Kitchen zolpidem (AMBIEN) 5 MG tablet  Take 1-2 tablets (5-10 mg total) by mouth at bedtime as needed for sleep.                                  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 Smoked approx 1 yr socailly in 1968.  ETOH socially  Objective:   Physical Exam    07/04/2020    134  07/20/2014        137 > 09/10/2014  136 >  11/09/2015  138 > 07/19/2016  131 > 07/22/2017  129     09/18/12 135 lb (61.236 kg)  03/18/12 141 lb 6.4 oz (64.139 kg)  02/26/12 139 lb 3.2 oz (63.141 kg)       Vital signs reviewed  07/04/2020  - Note at rest 02 sats  96% on RA   General appearance:   Robust pleasant amb wf nad   HEENT : pt wearing mask not removed for exam due to covid -19 concerns.    NECK :  without JVD/Nodes/TM/ nl carotid upstrokes bilaterally   LUNGS: no acc muscle use,  Nl contour chest minimal insp/exp rhonchi  bilaterally without cough on insp or exp maneuvers   CV:  RRR  no s3 or murmur or increase in P2, and no edema   ABD:  soft and nontender with nl inspiratory excursion in the supine position. No bruits or organomegaly appreciated, bowel sounds nl  MS:  Nl gait/ ext warm without deformities, calf tenderness, cyanosis or clubbing No obvious joint restrictions   SKIN: warm and dry without lesions    NEURO:  alert, approp, nl sensorium with  no motor or cerebellar deficits apparent.               Assessment:

## 2020-07-05 ENCOUNTER — Encounter: Payer: Self-pay | Admitting: Internal Medicine

## 2020-07-05 NOTE — Assessment & Plan Note (Addendum)
Developed p pneumoccal pna/sepsis/ards age 80  - CT chest 02/26/12 1. Severe chronic upper lobe bronchiectasis worse on the left.  2. Small cavitary lesion with central nodule within left upper  lobe may represent mycetoma.  3. Reticular nodular pattern within the right middle lobe suggest  chronic infection such as MAI  4. Irregular nodule in the left lung base - Vest added 03/28/12  - 07/20/2014   Try symbicort 80 2bid   - PFTs  09/10/2014  FEV1  1.85 ( 72%) ratio 72 and dlco 79%  - flutter added 07/19/2016    07/22/2017 rec Levaquin 500 mg qd x 7 days prn flare    No recent flares maint on low dose symb 2 bid and minimal need for saba.   No change in recs          Each maintenance medication was reviewed in detail including emphasizing most importantly the difference between maintenance and prns and under what circumstances the prns are to be triggered using an action plan format where appropriate.  Total time for H and P, chart review, counseling, reviewing hfa device(s) and generating customized AVS unique to this office visit / same day charting = 25 min

## 2020-07-12 ENCOUNTER — Other Ambulatory Visit: Payer: Self-pay | Admitting: Internal Medicine

## 2020-07-12 MED ORDER — ALBUTEROL SULFATE HFA 108 (90 BASE) MCG/ACT IN AERS: 2.0000 | INHALATION_SPRAY | Freq: Four times a day (QID) | RESPIRATORY_TRACT | 1 refills | Status: AC | PRN

## 2020-12-29 ENCOUNTER — Other Ambulatory Visit: Payer: Self-pay | Admitting: Surgery

## 2020-12-29 MED ORDER — CEPHALEXIN 500 MG PO CAPS: 500.0000 mg | ORAL_CAPSULE | Freq: Three times a day (TID) | ORAL | 0 refills | Status: AC

## 2021-04-04 ENCOUNTER — Emergency Department (HOSPITAL_COMMUNITY): Payer: Medicare Other

## 2021-04-04 ENCOUNTER — Observation Stay (HOSPITAL_COMMUNITY)
Admission: EM | Admit: 2021-04-04 | Discharge: 2021-04-04 | Disposition: A | Discharge status: Home / Self Care | Payer: Medicare Other | Attending: General Surgery | Admitting: General Surgery

## 2021-04-04 ENCOUNTER — Other Ambulatory Visit: Payer: Self-pay

## 2021-04-04 DIAGNOSIS — J479 Bronchiectasis, uncomplicated: Secondary | ICD-10-CM | POA: Insufficient documentation

## 2021-04-04 DIAGNOSIS — I4510 Unspecified right bundle-branch block: Secondary | ICD-10-CM | POA: Insufficient documentation

## 2021-04-04 DIAGNOSIS — J9811 Atelectasis: Secondary | ICD-10-CM | POA: Insufficient documentation

## 2021-04-04 DIAGNOSIS — S62512B Displaced fracture of proximal phalanx of left thumb, initial encounter for open fracture: Principal | ICD-10-CM | POA: Insufficient documentation

## 2021-04-04 DIAGNOSIS — Z8249 Family history of ischemic heart disease and other diseases of the circulatory system: Secondary | ICD-10-CM | POA: Diagnosis not present

## 2021-04-04 DIAGNOSIS — K862 Cyst of pancreas: Secondary | ICD-10-CM | POA: Diagnosis not present

## 2021-04-04 DIAGNOSIS — M47812 Spondylosis without myelopathy or radiculopathy, cervical region: Secondary | ICD-10-CM | POA: Diagnosis not present

## 2021-04-04 DIAGNOSIS — J984 Other disorders of lung: Secondary | ICD-10-CM | POA: Diagnosis not present

## 2021-04-04 DIAGNOSIS — M25571 Pain in right ankle and joints of right foot: Secondary | ICD-10-CM | POA: Insufficient documentation

## 2021-04-04 DIAGNOSIS — Z87891 Personal history of nicotine dependence: Secondary | ICD-10-CM | POA: Diagnosis not present

## 2021-04-04 DIAGNOSIS — Z20822 Contact with and (suspected) exposure to covid-19: Secondary | ICD-10-CM | POA: Diagnosis not present

## 2021-04-04 DIAGNOSIS — R0781 Pleurodynia: Secondary | ICD-10-CM | POA: Insufficient documentation

## 2021-04-04 DIAGNOSIS — S06360A Traumatic hemorrhage of cerebrum, unspecified, without loss of consciousness, initial encounter: Secondary | ICD-10-CM | POA: Insufficient documentation

## 2021-04-04 DIAGNOSIS — M19071 Primary osteoarthritis, right ankle and foot: Secondary | ICD-10-CM | POA: Diagnosis not present

## 2021-04-04 DIAGNOSIS — S6992XA Unspecified injury of left wrist, hand and finger(s), initial encounter: Secondary | ICD-10-CM | POA: Diagnosis present

## 2021-04-04 DIAGNOSIS — Z8701 Personal history of pneumonia (recurrent): Secondary | ICD-10-CM | POA: Insufficient documentation

## 2021-04-04 DIAGNOSIS — Z8709 Personal history of other diseases of the respiratory system: Secondary | ICD-10-CM | POA: Insufficient documentation

## 2021-04-04 DIAGNOSIS — R000 Tachycardia, unspecified: Secondary | ICD-10-CM | POA: Insufficient documentation

## 2021-04-04 DIAGNOSIS — Y9389 Activity, other specified: Secondary | ICD-10-CM | POA: Diagnosis not present

## 2021-04-04 DIAGNOSIS — M1712 Unilateral primary osteoarthritis, left knee: Secondary | ICD-10-CM | POA: Insufficient documentation

## 2021-04-04 DIAGNOSIS — S2231XA Fracture of one rib, right side, initial encounter for closed fracture: Secondary | ICD-10-CM | POA: Diagnosis not present

## 2021-04-04 DIAGNOSIS — M79642 Pain in left hand: Secondary | ICD-10-CM | POA: Insufficient documentation

## 2021-04-04 DIAGNOSIS — Y92410 Unspecified street and highway as the place of occurrence of the external cause: Secondary | ICD-10-CM | POA: Insufficient documentation

## 2021-04-04 DIAGNOSIS — M79662 Pain in left lower leg: Secondary | ICD-10-CM | POA: Insufficient documentation

## 2021-04-04 DIAGNOSIS — Z7951 Long term (current) use of inhaled steroids: Secondary | ICD-10-CM | POA: Diagnosis not present

## 2021-04-04 DIAGNOSIS — I700 Atherosclerosis of aorta: Secondary | ICD-10-CM | POA: Diagnosis not present

## 2021-04-04 DIAGNOSIS — Z79899 Other long term (current) drug therapy: Secondary | ICD-10-CM | POA: Insufficient documentation

## 2021-04-04 DIAGNOSIS — R0682 Tachypnea, not elsewhere classified: Secondary | ICD-10-CM | POA: Insufficient documentation

## 2021-04-04 DIAGNOSIS — I10 Essential (primary) hypertension: Secondary | ICD-10-CM | POA: Insufficient documentation

## 2021-04-04 DIAGNOSIS — I119 Hypertensive heart disease without heart failure: Secondary | ICD-10-CM | POA: Insufficient documentation

## 2021-04-04 DIAGNOSIS — I2510 Atherosclerotic heart disease of native coronary artery without angina pectoris: Secondary | ICD-10-CM | POA: Diagnosis not present

## 2021-04-04 DIAGNOSIS — T1490XA Injury, unspecified, initial encounter: Secondary | ICD-10-CM

## 2021-04-04 LAB — I-STAT CHEM 8, ED
BUN: 18 mg/dL (ref 8–23)
Calcium, Ion: 1.1 mmol/L — ABNORMAL LOW (ref 1.15–1.40)
Chloride: 101 mmol/L (ref 98–111)
Creatinine, Ser: 0.6 mg/dL (ref 0.44–1.00)
Glucose, Bld: 102 mg/dL — ABNORMAL HIGH (ref 70–99)
HCT: 40 % (ref 36.0–46.0)
Hemoglobin: 13.6 g/dL (ref 12.0–15.0)
Potassium: 4.1 mmol/L (ref 3.5–5.1)
Sodium: 139 mmol/L (ref 135–145)
TCO2: 31 mmol/L (ref 22–32)

## 2021-04-04 LAB — COMPREHENSIVE METABOLIC PANEL
ALT: 25 U/L (ref 0–44)
AST: 34 U/L (ref 15–41)
Albumin: 3.1 g/dL — ABNORMAL LOW (ref 3.5–5.0)
Alkaline Phosphatase: 76 U/L (ref 38–126)
Anion gap: 8 (ref 5–15)
BUN: 15 mg/dL (ref 8–23)
CO2: 29 mmol/L (ref 22–32)
Calcium: 8.8 mg/dL — ABNORMAL LOW (ref 8.9–10.3)
Chloride: 101 mmol/L (ref 98–111)
Creatinine, Ser: 0.64 mg/dL (ref 0.44–1.00)
GFR, Estimated: >60 mL/min (ref >60)
Glucose, Bld: 103 mg/dL — ABNORMAL HIGH (ref 70–99)
Potassium: 3.9 mmol/L (ref 3.5–5.1)
Sodium: 138 mmol/L (ref 135–145)
Total Bilirubin: 0.4 mg/dL (ref 0.3–1.2)
Total Protein: 7 g/dL (ref 6.5–8.1)

## 2021-04-04 LAB — CBC
HCT: 40.3 % (ref 36.0–46.0)
Hemoglobin: 12.7 g/dL (ref 12.0–15.0)
MCH: 30.8 pg (ref 26.0–34.0)
MCHC: 31.5 g/dL (ref 30.0–36.0)
MCV: 97.8 fL (ref 80.0–100.0)
Platelets: 269 10*3/uL (ref 150–400)
RBC: 4.12 MIL/uL (ref 3.87–5.11)
RDW: 14.4 % (ref 11.5–15.5)
WBC: 8.9 10*3/uL (ref 4.0–10.5)
nRBC: 0 % (ref 0.0–0.2)

## 2021-04-04 LAB — URINALYSIS, ROUTINE W REFLEX MICROSCOPIC
Bilirubin Urine: NEGATIVE
Glucose, UA: NEGATIVE mg/dL
Hgb urine dipstick: NEGATIVE
Ketones, ur: NEGATIVE mg/dL
Leukocytes,Ua: NEGATIVE
Nitrite: NEGATIVE
Protein, ur: NEGATIVE mg/dL
Specific Gravity, Urine: 1.025 (ref 1.005–1.030)
pH: 7 (ref 5.0–8.0)

## 2021-04-04 LAB — RESP PANEL BY RT-PCR (FLU A&B, COVID) ARPGX2
Influenza A by PCR: NEGATIVE
Influenza B by PCR: NEGATIVE
SARS Coronavirus 2 by RT PCR: POSITIVE — AB

## 2021-04-04 LAB — SAMPLE TO BLOOD BANK

## 2021-04-04 LAB — PROTIME-INR
INR: 0.9 (ref 0.8–1.2)
Prothrombin Time: 12.3 seconds (ref 11.4–15.2)

## 2021-04-04 LAB — ETHANOL: Alcohol, Ethyl (B): <10 mg/dL (ref <10)

## 2021-04-04 LAB — LACTIC ACID, PLASMA: Lactic Acid, Venous: 0.9 mmol/L (ref 0.5–1.9)

## 2021-04-04 MED ORDER — SODIUM CHLORIDE 0.9% FLUSH: 3.0000 mL | Freq: Two times a day (BID) | INTRAVENOUS | Status: DC

## 2021-04-04 MED ORDER — CEFAZOLIN SODIUM-DEXTROSE 1-4 GM/50ML-% IV SOLN: 1.0000 g | Freq: Once | INTRAVENOUS | Status: DC

## 2021-04-04 MED ORDER — KETOROLAC TROMETHAMINE 15 MG/ML IJ SOLN: 15.0000 mg | Freq: Four times a day (QID) | INTRAMUSCULAR | Status: DC | PRN

## 2021-04-04 MED ORDER — CEPHALEXIN 500 MG PO CAPS: 500.0000 mg | ORAL_CAPSULE | Freq: Four times a day (QID) | ORAL | 0 refills | Status: AC

## 2021-04-04 MED ORDER — ONDANSETRON HCL 4 MG/2ML IJ SOLN: 4.0000 mg | Freq: Four times a day (QID) | INTRAMUSCULAR | Status: DC | PRN

## 2021-04-04 MED ORDER — ALBUTEROL SULFATE (2.5 MG/3ML) 0.083% IN NEBU: 3.0000 mL | INHALATION_SOLUTION | Freq: Four times a day (QID) | RESPIRATORY_TRACT | Status: DC | PRN

## 2021-04-04 MED ORDER — LIDOCAINE-EPINEPHRINE (PF) 2 %-1:200000 IJ SOLN
INTRAMUSCULAR | Status: AC
  Administered 2021-04-04: 10 mL via INTRADERMAL
  Filled 2021-04-04: qty 20

## 2021-04-04 MED ORDER — ONDANSETRON 4 MG PO TBDP: 4.0000 mg | ORAL_TABLET | Freq: Four times a day (QID) | ORAL | Status: DC | PRN

## 2021-04-04 MED ORDER — SODIUM CHLORIDE 0.9 % IV SOLN: 250.0000 mL | INTRAVENOUS | Status: DC | PRN

## 2021-04-04 MED ORDER — CEFAZOLIN SODIUM-DEXTROSE 2-4 GM/100ML-% IV SOLN
2.0000 g | Freq: Once | INTRAVENOUS | Status: AC
  Administered 2021-04-04: 2 g via INTRAVENOUS
  Filled 2021-04-04: qty 100

## 2021-04-04 MED ORDER — MOMETASONE FURO-FORMOTEROL FUM 100-5 MCG/ACT IN AERO: 2.0000 | INHALATION_SPRAY | Freq: Two times a day (BID) | RESPIRATORY_TRACT | Status: DC

## 2021-04-04 MED ORDER — LIDOCAINE-EPINEPHRINE (PF) 2 %-1:200000 IJ SOLN: 20.0000 mL | Freq: Once | INTRAMUSCULAR | Status: AC

## 2021-04-04 MED ORDER — SODIUM CHLORIDE 0.9% FLUSH
3.0000 mL | INTRAVENOUS | Status: DC | PRN
Start: 2021-04-04 — End: 2021-04-05

## 2021-04-04 MED ORDER — IOHEXOL 300 MG/ML  SOLN
100.0000 mL | Freq: Once | INTRAMUSCULAR | Status: AC | PRN
  Administered 2021-04-04: 100 mL via INTRAVENOUS
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
## 2021-04-04 NOTE — ED Notes (Signed)
Noted EKG changes on cardiac monitor. Pt denies any CP. Pt states she could have pain in her chest if she thought about it hard. EKG given to Dr. Vanita Panda. Ortho tech paged for splint placement. No acute changes noted. Will continue to monitor.  ?
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
## 2021-04-04 NOTE — Progress Notes (Signed)
Orthopedic Tech Progress Note ?Patient Details:  ?[place] ?08-20-1940 ?[id number] ? ?Ortho Devices ?Type of Ortho Device: Thumb spica splint ?Splint Material: Fiberglass ?Ortho Device/Splint Location: LUE ?Ortho Device/Splint Interventions: Ordered, Application, Adjustment ?  ?Post Interventions ?Patient Tolerated: Well ?Instructions Provided: Care of device ?Splint applied by MD. ? ?[place] ?04/04/2021, 9:43 PM ? ?

## 2021-04-04 NOTE — ED Notes (Signed)
Ortho tech at bedside to apply splint ? ?

## 2021-04-04 NOTE — Progress Notes (Signed)
?   04/04/21 1713  ?Clinical Encounter Type  ?Visited With Patient not available  ?Visit Type Trauma  ?Referral From Nurse  ?Consult/Referral To Chaplain  ? ?Chaplain Jorene Guest responded to the level 2 page. Upon arrival the patient was being attended to by the medical team. There is no support person present. Chaplain remains available for additional follow up support as needed. This note was prepared by Jeanine Luz, M.Div..  For questions please contact by phone [phone].   ?

## 2021-04-04 NOTE — ED Notes (Signed)
Pt's husband went to the pharmacy to pick up her medications. Waiting for him to return to D/C pt home. ?
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
## 2021-05-04 ENCOUNTER — Other Ambulatory Visit (HOSPITAL_COMMUNITY): Payer: Self-pay | Admitting: Dermatology

## 2021-05-04 DIAGNOSIS — M79606 Pain in leg, unspecified: Secondary | ICD-10-CM

## 2021-05-05 ENCOUNTER — Ambulatory Visit (HOSPITAL_COMMUNITY)
Admission: RE | Admit: 2021-05-05 | Discharge: 2021-05-05 | Disposition: A | Discharge status: Home / Self Care | Payer: Medicare Other | Source: Ambulatory Visit | Attending: Dermatology | Admitting: Dermatology

## 2021-05-05 DIAGNOSIS — M7989 Other specified soft tissue disorders: Secondary | ICD-10-CM | POA: Insufficient documentation

## 2021-05-05 DIAGNOSIS — M79606 Pain in leg, unspecified: Secondary | ICD-10-CM | POA: Insufficient documentation

## 2021-05-05 DIAGNOSIS — R609 Edema, unspecified: Secondary | ICD-10-CM | POA: Diagnosis not present

## 2021-05-08 ENCOUNTER — Telehealth: Payer: Self-pay | Admitting: Internal Medicine

## 2021-05-08 NOTE — Telephone Encounter (Signed)
Called and spoke with pt's spouse Jaime Fitzpatrick and made him aware that MW was currently out of the office and asked if there was anything that I could help him with and he stated that pt's O2 sats have been dropping in the 80s and he wanted to know what might need to be done due to this. ? ?Stated to him that we needed to get her evaluated to see if they continue to drop in the 80s when she is seen as this will qualify her for O2. Jaime Fitzpatrick verbalized understanding. Appt scheduled for pt with BW 4/11. Nothing further needed. ?

## 2021-05-09 ENCOUNTER — Other Ambulatory Visit: Payer: Self-pay | Admitting: Internal Medicine

## 2021-05-09 ENCOUNTER — Encounter: Payer: Self-pay | Admitting: Primary Care

## 2021-05-09 ENCOUNTER — Ambulatory Visit (INDEPENDENT_AMBULATORY_CARE_PROVIDER_SITE_OTHER): Payer: Medicare Other

## 2021-05-09 ENCOUNTER — Ambulatory Visit: Payer: Medicare Other | Admitting: Primary Care

## 2021-05-09 VITALS — BP 130/80 | HR 90 | Ht 69.0 in | Wt 140.4 lb

## 2021-05-09 DIAGNOSIS — J471 Bronchiectasis with (acute) exacerbation: Secondary | ICD-10-CM | POA: Diagnosis not present

## 2021-05-09 DIAGNOSIS — J9601 Acute respiratory failure with hypoxia: Secondary | ICD-10-CM

## 2021-05-09 DIAGNOSIS — R4182 Altered mental status, unspecified: Secondary | ICD-10-CM

## 2021-05-09 DIAGNOSIS — J479 Bronchiectasis, uncomplicated: Secondary | ICD-10-CM | POA: Diagnosis not present

## 2021-05-09 DIAGNOSIS — M7989 Other specified soft tissue disorders: Secondary | ICD-10-CM

## 2021-05-09 DIAGNOSIS — J9611 Chronic respiratory failure with hypoxia: Secondary | ICD-10-CM | POA: Insufficient documentation

## 2021-05-09 MED ORDER — LEVOFLOXACIN 500 MG PO TABS: 500.0000 mg | ORAL_TABLET | Freq: Every day | ORAL | 0 refills | Status: DC

## 2021-05-09 NOTE — Assessment & Plan Note (Signed)
-   PCP is currently working this up. She had BLE dopplers on 05/05/21 that were negative for DVT. Needs echocardiogram. Checking BNP.  ?
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
## 2021-05-09 NOTE — Progress Notes (Signed)
I tried calling patient, LM. Please let her/husband know CXR showed no acute findings. They did not mention rib fraction or pleural effusion. She had changes for COPD/emphysema. Cavitary lesion LUL seen on prior CT exam no adequately demonstrated. Cardiac enlargement. Awaiting labs. I believed her PCP ordered echo which I agree with her having.

## 2021-05-09 NOTE — Progress Notes (Signed)
*  fracture

## 2021-05-09 NOTE — Patient Instructions (Addendum)
Recommendations: ?Wear 2L oxygen with exertion and at bedtime  ?Continue mucinex and chest physical therapy ?Use flutter valve three times a day  ?Wear compression stockings during the daytime  ?Take Levaquin as prescribed for bronchiectasis flare  ?Follow up with PCP regarding leg swelling/echocardiogram  ? ?Orders: ?New oxygen start- 2L with exertion and bedtime (qualify for POC if able) ?ONO on RA (ordered) ?CXR today (ordered) ?Labs today (ordered) ? ?Rx: ?Levaquin '500mg'$  daily x 7 days  ? ?Follow-up: ?10 days with Dr. Melvyn Novas or Eustaquio Maize NP  ? ?

## 2021-05-09 NOTE — Assessment & Plan Note (Deleted)
-   Patient has chronic cough, worse last three weeks. Cough is productive with purulent mucus. She is not compliant with flutter valve or percussion vest. Husband performing CPT daily. CXR today showed no acute process, COPD changes with left upper lobe scarring and chronic interstitial prominence and mild bibasilar atelectasis. Sending in [place] '500mg'$  daily x 7 days.  ?
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
## 2021-05-09 NOTE — Assessment & Plan Note (Addendum)
-   Patient has chronic cough, worse last three weeks. Cough is productive with purulent mucus. She is not compliant with flutter valve or percussion vest. Husband performing CPT daily. CXR today showed no acute process, COPD changes with left upper lobe scarring and chronic interstitial prominence and mild bibasilar atelectasis. Sending in Arnold '500mg'$  daily x 7 days. FU in 10 days. ?

## 2021-05-10 ENCOUNTER — Other Ambulatory Visit (HOSPITAL_COMMUNITY): Payer: Self-pay | Admitting: Internal Medicine

## 2021-05-10 DIAGNOSIS — R008 Other abnormalities of heart beat: Secondary | ICD-10-CM

## 2021-05-10 LAB — BASIC METABOLIC PANEL
BUN: 13 mg/dL (ref 6–23)
CO2: 33 mEq/L — ABNORMAL HIGH (ref 19–32)
Calcium: 9.1 mg/dL (ref 8.4–10.5)
Chloride: 100 mEq/L (ref 96–112)
Creatinine, Ser: 0.58 mg/dL (ref 0.40–1.20)
GFR: 85.6 mL/min (ref >60.00)
Glucose, Bld: 90 mg/dL (ref 70–99)
Potassium: 4.3 mEq/L (ref 3.5–5.1)
Sodium: 140 mEq/L (ref 135–145)

## 2021-05-10 LAB — BRAIN NATRIURETIC PEPTIDE: Pro B Natriuretic peptide (BNP): 262 pg/mL — ABNORMAL HIGH (ref 0.0–100.0)

## 2021-05-10 NOTE — Progress Notes (Signed)
Please let patient know BMET was ok, co2 was 33 (normal 19-32). Not concerning. BNP was elevated at 262, please make sure her PCP has ordered echo to evaluate heart function. I send CXR results to you as well please relay

## 2021-05-11 ENCOUNTER — Telehealth: Payer: Self-pay | Admitting: Primary Care

## 2021-05-11 NOTE — Telephone Encounter (Signed)
Closing encounter since pt already contacted by cards with appt for ECHO ?

## 2021-05-11 NOTE — Telephone Encounter (Signed)
Penuelas husband is returning phone call. MIchael phone number is [phone]. ?

## 2021-05-13 ENCOUNTER — Ambulatory Visit
Admission: RE | Admit: 2021-05-13 | Discharge: 2021-05-13 | Disposition: A | Discharge status: Home / Self Care | Payer: Medicare Other | Source: Ambulatory Visit | Attending: Internal Medicine | Admitting: Internal Medicine

## 2021-05-13 DIAGNOSIS — R4182 Altered mental status, unspecified: Secondary | ICD-10-CM

## 2021-05-18 ENCOUNTER — Encounter: Payer: Self-pay | Admitting: Primary Care

## 2021-05-18 ENCOUNTER — Ambulatory Visit: Payer: Medicare Other | Admitting: Primary Care

## 2021-05-18 VITALS — BP 118/62 | HR 93 | Temp 98.4°F | Ht 69.0 in | Wt 140.2 lb

## 2021-05-18 DIAGNOSIS — J471 Bronchiectasis with (acute) exacerbation: Secondary | ICD-10-CM

## 2021-05-18 DIAGNOSIS — J9611 Chronic respiratory failure with hypoxia: Secondary | ICD-10-CM

## 2021-05-18 MED ORDER — LEVOFLOXACIN 500 MG PO TABS: 500.0000 mg | ORAL_TABLET | Freq: Every day | ORAL | 0 refills | Status: AC

## 2021-05-18 NOTE — Assessment & Plan Note (Addendum)
-   O2 desaturated 86% p walking 100-156f on room air. Continue 2L oxygen at bedtime and with exertion. Qualified for POC today at [facility] ?

## 2021-05-18 NOTE — Patient Instructions (Addendum)
?  Recommendations: ?Continue Symbicort 21mg two puffs twice daily ?Use albuterol 2 puffs every 4-6 hours as needed for sob/wheezing  ?Wear 2L oxygen with exertion and at bedtime  ?Continue mucinex and chest physical therapy ?Use flutter valve three times a day  ?Wear compression stockings during the daytime  ?Take additional 5 days Levaquin for bronchiectasis flare  ?Follow up with PCP regarding leg swelling/echocardiogram  ? ?Orders: ?Qualify for POC  ? ?Follow-up: ?2-3 months with Dr. WMelvyn Novas ?  ?

## 2021-05-18 NOTE — Assessment & Plan Note (Addendum)
-   Improving; She's about 75% better. Continues to have productive cough and scattered rhonchi on exam. Needs longer course Levaquin, extending additional 5 days. Continue mucinex and flutter valve as well as chest PT. FU in 2-3 months or sooner if needed. ?
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
## 2021-05-19 ENCOUNTER — Telehealth: Payer: Self-pay | Admitting: Primary Care

## 2021-05-19 DIAGNOSIS — J9611 Chronic respiratory failure with hypoxia: Secondary | ICD-10-CM

## 2021-05-19 NOTE — Telephone Encounter (Signed)
New O2 ordered placed for [facility].  ?

## 2021-05-22 ENCOUNTER — Ambulatory Visit (HOSPITAL_BASED_OUTPATIENT_CLINIC_OR_DEPARTMENT_OTHER)
Admission: RE | Admit: 2021-05-22 | Discharge: 2021-05-22 | Disposition: A | Discharge status: Home / Self Care | Payer: Medicare Other | Source: Ambulatory Visit | Attending: Internal Medicine | Admitting: Internal Medicine

## 2021-05-22 DIAGNOSIS — I281 Aneurysm of pulmonary artery: Secondary | ICD-10-CM | POA: Insufficient documentation

## 2021-05-22 DIAGNOSIS — I10 Essential (primary) hypertension: Secondary | ICD-10-CM | POA: Insufficient documentation

## 2021-05-22 DIAGNOSIS — J189 Pneumonia, unspecified organism: Secondary | ICD-10-CM | POA: Diagnosis not present

## 2021-05-22 DIAGNOSIS — R008 Other abnormalities of heart beat: Secondary | ICD-10-CM

## 2021-05-22 DIAGNOSIS — J159 Unspecified bacterial pneumonia: Secondary | ICD-10-CM | POA: Diagnosis not present

## 2021-05-22 LAB — ECHOCARDIOGRAM COMPLETE
Area-P 1/2: 3.81 cm2
S' Lateral: 2.5 cm

## 2021-05-23 ENCOUNTER — Encounter: Payer: Self-pay | Admitting: Internal Medicine

## 2021-05-23 NOTE — Progress Notes (Unsigned)
Contacted by patient by way of her husband asking me to review the echocardiogram ?

## 2021-05-24 ENCOUNTER — Telehealth: Payer: Self-pay | Admitting: Neurology

## 2021-05-24 ENCOUNTER — Telehealth: Payer: Self-pay | Admitting: Primary Care
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
## 2021-05-24 NOTE — Telephone Encounter (Signed)
Called and spoke with patient's husband Ronalee Belts. He stated that the patient had an echo on Monday 05/22/21 and she was diagnosed with pulmonary hypertension. Per Ronalee Belts, he was told that the echo has changed drastically since the last one (2017).  ? ?Ronalee Belts wanted to know if Dr. Melvyn Novas could possible call him to discuss the echo. I advised him that Dr. Melvyn Novas is in clinic all day today.  ? ?Dr. Melvyn Novas, can you please advise? Thanks!  ?

## 2021-05-24 NOTE — Telephone Encounter (Signed)
Advised to maintain sats above 90% sitting , walking and sleeping and will need w/u for PH to include CTa chest or VQ and f/u by Rowland Lathe as planned. ?

## 2021-05-24 NOTE — Telephone Encounter (Signed)
To see PCP today to discuss w/u  - nothing further needed for now ?

## 2021-05-24 NOTE — Telephone Encounter (Signed)
Called and spoke with pt's spouse Jaime Fitzpatrick letting him know the info per MW and he verbalized understanding. Nothing further needed. ?

## 2021-05-25 ENCOUNTER — Other Ambulatory Visit: Payer: Self-pay

## 2021-05-25 ENCOUNTER — Emergency Department (HOSPITAL_COMMUNITY): Payer: Medicare Other

## 2021-05-25 ENCOUNTER — Encounter (HOSPITAL_COMMUNITY): Payer: Self-pay | Admitting: Emergency Medicine

## 2021-05-25 ENCOUNTER — Inpatient Hospital Stay (HOSPITAL_COMMUNITY)
Admission: EM | Admit: 2021-05-25 | Discharge: 2021-05-29 | DRG: 193 | Disposition: E | Payer: Medicare Other | Attending: Internal Medicine | Admitting: Internal Medicine

## 2021-05-25 DIAGNOSIS — I452 Bifascicular block: Secondary | ICD-10-CM | POA: Diagnosis present

## 2021-05-25 DIAGNOSIS — J189 Pneumonia, unspecified organism: Secondary | ICD-10-CM | POA: Diagnosis present

## 2021-05-25 DIAGNOSIS — B479 Mycetoma, unspecified: Secondary | ICD-10-CM | POA: Diagnosis present

## 2021-05-25 DIAGNOSIS — Z66 Do not resuscitate: Secondary | ICD-10-CM | POA: Diagnosis not present

## 2021-05-25 DIAGNOSIS — Z79899 Other long term (current) drug therapy: Secondary | ICD-10-CM | POA: Diagnosis not present

## 2021-05-25 DIAGNOSIS — J159 Unspecified bacterial pneumonia: Principal | ICD-10-CM | POA: Diagnosis present

## 2021-05-25 DIAGNOSIS — I272 Pulmonary hypertension, unspecified: Secondary | ICD-10-CM | POA: Diagnosis not present

## 2021-05-25 DIAGNOSIS — Z515 Encounter for palliative care: Secondary | ICD-10-CM | POA: Diagnosis not present

## 2021-05-25 DIAGNOSIS — I2729 Other secondary pulmonary hypertension: Secondary | ICD-10-CM | POA: Diagnosis present

## 2021-05-25 DIAGNOSIS — G9341 Metabolic encephalopathy: Secondary | ICD-10-CM | POA: Diagnosis present

## 2021-05-25 DIAGNOSIS — J471 Bronchiectasis with (acute) exacerbation: Secondary | ICD-10-CM | POA: Diagnosis present

## 2021-05-25 DIAGNOSIS — I480 Paroxysmal atrial fibrillation: Secondary | ICD-10-CM | POA: Diagnosis not present

## 2021-05-25 DIAGNOSIS — F05 Delirium due to known physiological condition: Secondary | ICD-10-CM | POA: Diagnosis not present

## 2021-05-25 DIAGNOSIS — I110 Hypertensive heart disease with heart failure: Secondary | ICD-10-CM | POA: Diagnosis present

## 2021-05-25 DIAGNOSIS — K219 Gastro-esophageal reflux disease without esophagitis: Secondary | ICD-10-CM | POA: Diagnosis present

## 2021-05-25 DIAGNOSIS — J9611 Chronic respiratory failure with hypoxia: Secondary | ICD-10-CM | POA: Diagnosis not present

## 2021-05-25 DIAGNOSIS — I2781 Cor pulmonale (chronic): Secondary | ICD-10-CM | POA: Diagnosis present

## 2021-05-25 DIAGNOSIS — J151 Pneumonia due to Pseudomonas: Secondary | ICD-10-CM | POA: Diagnosis not present

## 2021-05-25 DIAGNOSIS — Z9981 Dependence on supplemental oxygen: Secondary | ICD-10-CM | POA: Diagnosis not present

## 2021-05-25 DIAGNOSIS — I4891 Unspecified atrial fibrillation: Secondary | ICD-10-CM | POA: Diagnosis not present

## 2021-05-25 DIAGNOSIS — J470 Bronchiectasis with acute lower respiratory infection: Secondary | ICD-10-CM | POA: Diagnosis present

## 2021-05-25 DIAGNOSIS — Z20822 Contact with and (suspected) exposure to covid-19: Secondary | ICD-10-CM | POA: Diagnosis present

## 2021-05-25 DIAGNOSIS — Z8249 Family history of ischemic heart disease and other diseases of the circulatory system: Secondary | ICD-10-CM

## 2021-05-25 DIAGNOSIS — J9621 Acute and chronic respiratory failure with hypoxia: Secondary | ICD-10-CM | POA: Diagnosis present

## 2021-05-25 DIAGNOSIS — Z885 Allergy status to narcotic agent status: Secondary | ICD-10-CM

## 2021-05-25 DIAGNOSIS — Z87891 Personal history of nicotine dependence: Secondary | ICD-10-CM | POA: Diagnosis not present

## 2021-05-25 DIAGNOSIS — I5033 Acute on chronic diastolic (congestive) heart failure: Secondary | ICD-10-CM | POA: Diagnosis present

## 2021-05-25 DIAGNOSIS — Z7951 Long term (current) use of inhaled steroids: Secondary | ICD-10-CM

## 2021-05-25 DIAGNOSIS — Z781 Physical restraint status: Secondary | ICD-10-CM

## 2021-05-25 DIAGNOSIS — Z880 Allergy status to penicillin: Secondary | ICD-10-CM

## 2021-05-25 DIAGNOSIS — J9601 Acute respiratory failure with hypoxia: Secondary | ICD-10-CM | POA: Diagnosis not present

## 2021-05-25 DIAGNOSIS — Z888 Allergy status to other drugs, medicaments and biological substances status: Secondary | ICD-10-CM | POA: Diagnosis not present

## 2021-05-25 LAB — CBC WITH DIFFERENTIAL/PLATELET
Abs Immature Granulocytes: 0.02 10*3/uL (ref 0.00–0.07)
Basophils Absolute: 0 10*3/uL (ref 0.0–0.1)
Basophils Relative: 0 %
Eosinophils Absolute: 0 10*3/uL (ref 0.0–0.5)
Eosinophils Relative: 0 %
HCT: 37.3 % (ref 36.0–46.0)
Hemoglobin: 11 g/dL — ABNORMAL LOW (ref 12.0–15.0)
Immature Granulocytes: 0 %
Lymphocytes Relative: 19 %
Lymphs Abs: 1.2 10*3/uL (ref 0.7–4.0)
MCH: 29.3 pg (ref 26.0–34.0)
MCHC: 29.5 g/dL — ABNORMAL LOW (ref 30.0–36.0)
MCV: 99.2 fL (ref 80.0–100.0)
Monocytes Absolute: 0.6 10*3/uL (ref 0.1–1.0)
Monocytes Relative: 9 %
Neutro Abs: 4.5 10*3/uL (ref 1.7–7.7)
Neutrophils Relative %: 72 %
Platelets: 200 10*3/uL (ref 150–400)
RBC: 3.76 MIL/uL — ABNORMAL LOW (ref 3.87–5.11)
RDW: 14.6 % (ref 11.5–15.5)
WBC: 6.3 10*3/uL (ref 4.0–10.5)
nRBC: 0 % (ref 0.0–0.2)

## 2021-05-25 LAB — COMPREHENSIVE METABOLIC PANEL
ALT: 15 U/L (ref 0–44)
AST: 19 U/L (ref 15–41)
Albumin: 2.9 g/dL — ABNORMAL LOW (ref 3.5–5.0)
Alkaline Phosphatase: 57 U/L (ref 38–126)
Anion gap: 5 (ref 5–15)
BUN: 10 mg/dL (ref 8–23)
CO2: 35 mmol/L — ABNORMAL HIGH (ref 22–32)
Calcium: 8.4 mg/dL — ABNORMAL LOW (ref 8.9–10.3)
Chloride: 98 mmol/L (ref 98–111)
Creatinine, Ser: 0.5 mg/dL (ref 0.44–1.00)
GFR, Estimated: >60 mL/min (ref >60)
Glucose, Bld: 98 mg/dL (ref 70–99)
Potassium: 4.2 mmol/L (ref 3.5–5.1)
Sodium: 138 mmol/L (ref 135–145)
Total Bilirubin: 0.3 mg/dL (ref 0.3–1.2)
Total Protein: 6.7 g/dL (ref 6.5–8.1)

## 2021-05-25 LAB — I-STAT VENOUS BLOOD GAS, ED
Acid-Base Excess: 10 mmol/L — ABNORMAL HIGH (ref 0.0–2.0)
Bicarbonate: 37.6 mmol/L — ABNORMAL HIGH (ref 20.0–28.0)
Calcium, Ion: 0.94 mmol/L — ABNORMAL LOW (ref 1.15–1.40)
HCT: 35 % — ABNORMAL LOW (ref 36.0–46.0)
Hemoglobin: 11.9 g/dL — ABNORMAL LOW (ref 12.0–15.0)
O2 Saturation: 99 %
Potassium: 4.6 mmol/L (ref 3.5–5.1)
Sodium: 134 mmol/L — ABNORMAL LOW (ref 135–145)
TCO2: 39 mmol/L — ABNORMAL HIGH (ref 22–32)
pCO2, Ven: 62.2 mmHg — ABNORMAL HIGH (ref 44–60)
pH, Ven: 7.389 (ref 7.25–7.43)
pO2, Ven: 126 mmHg — ABNORMAL HIGH (ref 32–45)

## 2021-05-25 LAB — EXPECTORATED SPUTUM ASSESSMENT W GRAM STAIN, RFLX TO RESP C

## 2021-05-25 LAB — URINALYSIS, ROUTINE W REFLEX MICROSCOPIC
Bilirubin Urine: NEGATIVE
Glucose, UA: NEGATIVE mg/dL
Hgb urine dipstick: NEGATIVE
Ketones, ur: NEGATIVE mg/dL
Leukocytes,Ua: NEGATIVE
Nitrite: NEGATIVE
Protein, ur: NEGATIVE mg/dL
Specific Gravity, Urine: 1.013 (ref 1.005–1.030)
pH: 5 (ref 5.0–8.0)

## 2021-05-25 LAB — MRSA NEXT GEN BY PCR, NASAL: MRSA by PCR Next Gen: DETECTED — AB

## 2021-05-25 LAB — APTT: aPTT: 33 seconds (ref 24–36)

## 2021-05-25 LAB — PROTIME-INR
INR: 1.1 (ref 0.8–1.2)
Prothrombin Time: 13.6 seconds (ref 11.4–15.2)

## 2021-05-25 LAB — LACTIC ACID, PLASMA: Lactic Acid, Venous: 0.8 mmol/L (ref 0.5–1.9)

## 2021-05-25 LAB — BRAIN NATRIURETIC PEPTIDE: B Natriuretic Peptide: 294.3 pg/mL — ABNORMAL HIGH (ref 0.0–100.0)

## 2021-05-25 LAB — RESP PANEL BY RT-PCR (FLU A&B, COVID) ARPGX2
Influenza A by PCR: NEGATIVE
Influenza B by PCR: NEGATIVE
SARS Coronavirus 2 by RT PCR: NEGATIVE

## 2021-05-25 LAB — STREP PNEUMONIAE URINARY ANTIGEN: Strep Pneumo Urinary Antigen: NEGATIVE

## 2021-05-25 LAB — PROCALCITONIN: Procalcitonin: <0.1 ng/mL

## 2021-05-25 MED ORDER — ADULT MULTIVITAMIN W/MINERALS CH
1.0000 | ORAL_TABLET | Freq: Every day | ORAL | Status: DC
  Administered 2021-05-25 – 2021-05-27 (×3): 1 via ORAL
  Filled 2021-05-25 (×4): qty 1

## 2021-05-25 MED ORDER — IPRATROPIUM-ALBUTEROL 0.5-2.5 (3) MG/3ML IN SOLN
3.0000 mL | Freq: Four times a day (QID) | RESPIRATORY_TRACT | Status: DC
  Administered 2021-05-25 – 2021-05-26 (×4): 3 mL via RESPIRATORY_TRACT
  Filled 2021-05-25 (×4): qty 3

## 2021-05-25 MED ORDER — RISAQUAD PO CAPS
1.0000 | ORAL_CAPSULE | Freq: Every day | ORAL | Status: DC
  Administered 2021-05-26 – 2021-05-27 (×3): 1 via ORAL
  Filled 2021-05-25 (×5): qty 1

## 2021-05-25 MED ORDER — VITAMIN D 25 MCG (1000 UNIT) PO TABS
1000.0000 [IU] | ORAL_TABLET | Freq: Every day | ORAL | Status: DC
Start: 2021-05-25 — End: 2021-05-28
  Administered 2021-05-25 – 2021-05-27 (×3): 1000 [IU] via ORAL
  Filled 2021-05-25 (×4): qty 1

## 2021-05-25 MED ORDER — FUROSEMIDE 10 MG/ML IJ SOLN
20.0000 mg | Freq: Once | INTRAMUSCULAR | Status: AC
  Administered 2021-05-25: 20 mg via INTRAVENOUS
  Filled 2021-05-25: qty 2

## 2021-05-25 MED ORDER — METHENAMINE MANDELATE 1 G PO TABS
1000.0000 mg | ORAL_TABLET | Freq: Two times a day (BID) | ORAL | Status: DC
  Administered 2021-05-25 – 2021-05-27 (×6): 1000 mg via ORAL
  Filled 2021-05-25 (×7): qty 1

## 2021-05-25 MED ORDER — ALBUTEROL SULFATE HFA 108 (90 BASE) MCG/ACT IN AERS: 2.0000 | INHALATION_SPRAY | Freq: Four times a day (QID) | RESPIRATORY_TRACT | Status: DC | PRN

## 2021-05-25 MED ORDER — IRBESARTAN 300 MG PO TABS
300.0000 mg | ORAL_TABLET | Freq: Every day | ORAL | Status: DC
Start: 2021-05-26 — End: 2021-05-28
  Administered 2021-05-26 – 2021-05-27 (×2): 300 mg via ORAL
  Filled 2021-05-25 (×3): qty 1

## 2021-05-25 MED ORDER — TRIAMCINOLONE ACETONIDE 55 MCG/ACT NA AERO
1.0000 | INHALATION_SPRAY | Freq: Every day | NASAL | Status: DC
  Administered 2021-05-26 – 2021-05-27 (×3): 1 via NASAL
  Filled 2021-05-25 (×3): qty 10.8

## 2021-05-25 MED ORDER — SODIUM CHLORIDE 0.9 % IV SOLN
2.0000 g | Freq: Two times a day (BID) | INTRAVENOUS | Status: DC
  Administered 2021-05-25 – 2021-05-27 (×5): 2 g via INTRAVENOUS
  Filled 2021-05-25 (×6): qty 12.5

## 2021-05-25 MED ORDER — IPRATROPIUM-ALBUTEROL 0.5-2.5 (3) MG/3ML IN SOLN
3.0000 mL | Freq: Once | RESPIRATORY_TRACT | Status: AC
  Administered 2021-05-25: 3 mL via RESPIRATORY_TRACT
  Filled 2021-05-25: qty 3

## 2021-05-25 MED ORDER — COQ10 30 MG PO CAPS: ORAL_CAPSULE | Freq: Every day | ORAL | Status: DC

## 2021-05-25 MED ORDER — SODIUM CHLORIDE 0.9 % IV SOLN
2.0000 g | Freq: Once | INTRAVENOUS | Status: AC
  Administered 2021-05-25: 2 g via INTRAVENOUS
  Filled 2021-05-25: qty 12.5

## 2021-05-25 MED ORDER — VANCOMYCIN HCL 500 MG/100ML IV SOLN
500.0000 mg | Freq: Two times a day (BID) | INTRAVENOUS | Status: DC
  Administered 2021-05-26 – 2021-05-27 (×5): 500 mg via INTRAVENOUS
  Filled 2021-05-25 (×7): qty 100

## 2021-05-25 MED ORDER — FLUTICASONE FUROATE-VILANTEROL 100-25 MCG/ACT IN AEPB
1.0000 | INHALATION_SPRAY | Freq: Every day | RESPIRATORY_TRACT | Status: DC
  Filled 2021-05-25 (×2): qty 28

## 2021-05-25 MED ORDER — PREDNISONE 20 MG PO TABS
40.0000 mg | ORAL_TABLET | Freq: Every day | ORAL | Status: DC
  Administered 2021-05-26 – 2021-05-28 (×3): 40 mg via ORAL
  Filled 2021-05-25 (×3): qty 2

## 2021-05-25 MED ORDER — GLUCOSAMINE 1500 COMPLEX PO CAPS
ORAL_CAPSULE | Freq: Two times a day (BID) | ORAL | Status: DC
Start: 2021-05-25 — End: 2021-05-25

## 2021-05-25 MED ORDER — ENOXAPARIN SODIUM 40 MG/0.4ML IJ SOSY
40.0000 mg | PREFILLED_SYRINGE | INTRAMUSCULAR | Status: DC
  Administered 2021-05-25 – 2021-05-26 (×2): 40 mg via SUBCUTANEOUS
  Filled 2021-05-25 (×2): qty 0.4

## 2021-05-25 MED ORDER — VANCOMYCIN HCL IN DEXTROSE 1-5 GM/200ML-% IV SOLN
1000.0000 mg | Freq: Once | INTRAVENOUS | Status: AC
  Administered 2021-05-25: 1000 mg via INTRAVENOUS
  Filled 2021-05-25: qty 200

## 2021-05-25 MED ORDER — GUAIFENESIN ER 600 MG PO TB12
1200.0000 mg | ORAL_TABLET | Freq: Two times a day (BID) | ORAL | Status: DC
  Administered 2021-05-25 – 2021-05-27 (×4): 1200 mg via ORAL
  Filled 2021-05-25 (×5): qty 2

## 2021-05-25 MED ORDER — ALBUTEROL SULFATE (2.5 MG/3ML) 0.083% IN NEBU: 2.5000 mg | INHALATION_SOLUTION | RESPIRATORY_TRACT | Status: DC | PRN
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
## 2021-05-25 NOTE — Progress Notes (Signed)
Pharmacy Antibiotic Note ? ?Jaime Fitzpatrick is a 81 y.o. female admitted on 05/15/2021 presenting with SOB.  Pharmacy has been consulted for cefepime and vancomycin dosing. ? ?Plan: ?Vancomycin 1g IV x 1, then 500 mg IV q 12h (eAUC 427, Goal AUC 400-550, SCr used 0.8) ?Add MRSA PCR ?Cefepime 2g IV q12h ?Monitor renal function, Cx/PCR to narrow ?Vancomycin levels as needed ? ?  ? ?Temp (24hrs), Avg:99.1 ?F (37.3 ?C), Min:99.1 ?F (37.3 ?C), Max:99.1 ?F (37.3 ?C) ? ?Recent Labs  ?Lab 05/18/2021 ?0904 05/06/2021 ?0919  ?WBC  --  6.3  ?CREATININE  --  0.50  ?LATICACIDVEN 0.8  --   ?  ?Estimated Creatinine Clearance: 56.3 mL/min (by C-G formula based on SCr of 0.5 mg/dL).   ? ?Allergies  ?Allergen Reactions  ? Acetaminophen-Codeine   ? Amoxicillin   ? Ampicillin   ? Avelox [Moxifloxacin] Hives and Dermatitis  ? Codeine   ?  Other reaction(s): Other (See Comments), Unknown ?Asthma ?Codeine with Tyenol patient states "I am able to take plain codeine ?  ? Erythromycin Ethylsuccinate   ? Lorazepam   ?  Other reaction(s): in effective for sleep  ? ? ?Bertis Ruddy, PharmD ?Clinical Pharmacist ?ED Pharmacist Phone # [phone] ?05/21/2021 10:23 AM ? ? ?

## 2021-05-25 NOTE — ED Provider Notes (Signed)
?[facility] ?Provider Note ? ? ?CSN: [id number] ?Arrival date & time: 05/12/2021  0846 ? ?  ? ?History ? ?Chief Complaint  ?Patient presents with  ? Code Sepsis  ? ? ?Jaime Fitzpatrick is a 81 y.o. female. ? ?HPI ?81 year old female presents from home with hypoxia.  History is primarily from the patient as well as the nurses spoke to EMS.  Patient does not wear oxygen at baseline but recently the husband has put her on oxygen because of low O2 sats.  This morning it was very low in the 60s.  It was down to 70% here in the emergency department on room air.  Patient recently had an admission for a rib fracture and subdural hematoma.  Patient reports a chronic cough.  She was recently put on antibiotics by her pulmonologist within the last week or so.  No leg swelling. No chest pain. ? ?Home Medications ?Prior to Admission medications   ?Medication Sig Start Date End Date Taking? Authorizing Provider  ?albuterol (VENTOLIN HFA) 108 (90 Base) MCG/ACT inhaler Inhale 2 puffs into the lungs every 6 (six) hours as needed for wheezing or shortness of breath. 07/12/20   Tanda Rockers, MD  ?Ascorbic Acid (VITAMIN C PO) Take by mouth daily.    [provider]  ?b complex vitamins tablet Take 1 tablet by mouth daily.    [provider]  ?budesonide-formoterol (SYMBICORT) 80-4.5 MCG/ACT inhaler INHALE 2 PUFFS BY MOUTH EVERY MORNING THEN 2 PUFFS 12 HOURS LATER 07/04/20   Tanda Rockers, MD  ?Cholecalciferol (VITAMIN D PO) Take 1 tablet by mouth daily.    [provider]  ?Coenzyme Q10 (COQ10 PO) Take by mouth daily.    [provider]  ?Glucosamine-Chondroit-Vit C-Mn (GLUCOSAMINE 1500 COMPLEX PO) Take 1 tablet by mouth 2 (two) times daily.    [provider]  ?guaiFENesin (MUCINEX) 600 MG 12 hr tablet Take 1,200 mg by mouth 2 (two) times daily.    [provider]  ?levofloxacin (LEVAQUIN) 500 MG tablet Take 1 tablet (500 mg total) by mouth  daily. 05/18/21   Martyn Ehrich, NP  ?methenamine (MANDELAMINE) 1 g tablet Take 1,000 mg by mouth 2 (two) times daily.    [provider]  ?Misc. Devices (ACAPELLA) MISC Use as directed 06/20/17   Parrett, Fonnie Mu, NP  ?Multiple Vitamins-Calcium (MULTI-DAY/CALCIUM/EXTRA IRON) TABS Take 1 tablet by mouth daily.    [provider]  ?Probiotic Product (PROBIOTIC PO) Take 1 capsule by mouth daily.    [provider]  ?Respiratory Therapy Supplies (FLUTTER) DEVI Use as directed ?Patient not taking: Reported on 05/09/2021 07/19/16   Tanda Rockers, MD  ?telmisartan (MICARDIS) 80 MG tablet Take 80 mg by mouth daily.    [provider]  ?triamcinolone (NASACORT) 55 MCG/ACT AERO nasal inhaler Place 1 spray into the nose daily.    [provider]  ?zolpidem (AMBIEN) 5 MG tablet Take 1-2 tablets (5-10 mg total) by mouth at bedtime as needed for sleep. ?Patient not taking: Reported on 05/09/2021 07/17/16   Tanda Rockers, MD  ?   ? ?Allergies    ?Acetaminophen-codeine, Amoxicillin, Ampicillin, Avelox [moxifloxacin], Codeine, Erythromycin ethylsuccinate, and Lorazepam   ? ?Review of Systems   ?Review of Systems  ?Constitutional:  Negative for fever.  ?Respiratory:  Positive for cough and shortness of breath.   ?Cardiovascular:  Negative for chest pain and leg swelling.  ?Gastrointestinal:  Negative for abdominal pain.  ? ?  Physical Exam ?Updated Vital Signs ?BP 116/65   Pulse 86   Temp (S) 99.8 ?F (37.7 ?C) (Rectal)   Resp (!) 24   SpO2 96%  ?Physical Exam ?Vitals and nursing note reviewed.  ?Constitutional:   ?   Appearance: She is well-developed. She is not diaphoretic.  ?HENT:  ?   Head: Normocephalic and atraumatic.  ?Cardiovascular:  ?   Rate and Rhythm: Normal rate and regular rhythm.  ?   Heart sounds: Normal heart sounds.  ?Pulmonary:  ?   Effort: Pulmonary effort is normal.  ?   Breath sounds: Rhonchi present.  ?Abdominal:  ?   General: There is no distension.  ?    Palpations: Abdomen is soft.  ?   Tenderness: There is no abdominal tenderness.  ?Skin: ?   General: Skin is warm and dry.  ?Neurological:  ?   Mental Status: She is alert and oriented to person, place, and time.  ? ? ?ED Results / Procedures / Treatments   ?Labs ?(all labs ordered are listed, but only abnormal results are displayed) ?Labs Reviewed  ?COMPREHENSIVE METABOLIC PANEL - Abnormal; Notable for the following components:  ?    Result Value  ? CO2 35 (*)   ? Calcium 8.4 (*)   ? Albumin 2.9 (*)   ? All other components within normal limits  ?CBC WITH DIFFERENTIAL/PLATELET - Abnormal; Notable for the following components:  ? RBC 3.76 (*)   ? Hemoglobin 11.0 (*)   ? MCHC 29.5 (*)   ? All other components within normal limits  ?BRAIN NATRIURETIC PEPTIDE - Abnormal; Notable for the following components:  ? B Natriuretic Peptide 294.3 (*)   ? All other components within normal limits  ?RESP PANEL BY RT-PCR (FLU A&B, COVID) [id number]  ?CULTURE, BLOOD (ROUTINE X 2)  ?CULTURE, BLOOD (ROUTINE X 2)  ?URINE CULTURE  ?MRSA NEXT GEN BY PCR, NASAL  ?LACTIC ACID, PLASMA  ?PROTIME-INR  ?APTT  ?URINALYSIS, ROUTINE W REFLEX MICROSCOPIC  ?BLOOD GAS, VENOUS  ? ? ?EKG ?EKG Interpretation ? ?Date/Time:  Thursday May 25 2021 08:52:07 EDT ?Ventricular Rate:  96 ?PR Interval:  175 ?QRS Duration: 122 ?QT Interval:  364 ?QTC Calculation: 460 ?R Axis:   -74 ?Text Interpretation: Sinus rhythm Left atrial enlargement Right bundle branch block Left ventricular hypertrophy  similar to Apr 04 2021 Confirmed by Sherwood Gambler [phone]) on 05/06/2021 9:08:22 AM ? ?Radiology ?DG Chest Port 1 View ? ?Result Date: 05/27/2021 ?CLINICAL DATA:  Questionable sepsis - evaluate for abnormality EXAM: PORTABLE CHEST 1 VIEW COMPARISON:  Radiograph 05/09/2021 FINDINGS: Unchanged enlarged cardiac silhouette. Aortic arch calcifications. There are diffuse interstitial opacities with increased basilar airspace disease comparison to prior. Unchanged left apical  consolidation and cavitation. No acute osseous abnormality. Left shoulder osteoarthritis. Thoracic spondylosis. IMPRESSION: Increased bibasilar airspace disease in comparison to prior, concerning for pneumonia. Unchanged chronic left apical consolidation and cavitation. Electronically Signed   By: Maurine Simmering M.D.   On: 04/30/2021 10:16   ? ?Procedures ?Procedures  ? ? ?Medications Ordered in ED ?Medications  ?ceFEPIme (MAXIPIME) 2 g in sodium chloride 0.9 % 100 mL IVPB (has no administration in time range)  ?vancomycin (VANCOREADY) IVPB 500 mg/100 mL (has no administration in time range)  ?methenamine (MANDELAMINE) tablet 1,000 mg (has no administration in time range)  ?irbesartan (AVAPRO) tablet 300 mg (has no administration in time range)  ?Probiotic TBEC (has no administration in time range)  ?CoQ10 CAPS (has no administration in time range)  ?Glucosamine  1500 Complex CAPS (has no administration in time range)  ?cholecalciferol (VITAMIN D3) tablet 1,000 Units (has no administration in time range)  ?Multi-Day/Calcium/Extra Iron TABS 1 tablet (has no administration in time range)  ?albuterol (VENTOLIN HFA) 108 (90 Base) MCG/ACT inhaler 2 puff (has no administration in time range)  ?fluticasone furoate-vilanterol (BREO ELLIPTA) 100-25 MCG/ACT 1 puff (has no administration in time range)  ?guaiFENesin (MUCINEX) 12 hr tablet 1,200 mg (has no administration in time range)  ?triamcinolone (NASACORT) nasal inhaler 1 spray (has no administration in time range)  ?enoxaparin (LOVENOX) injection 40 mg (has no administration in time range)  ?predniSONE (DELTASONE) tablet 40 mg (has no administration in time range)  ?ipratropium-albuterol (DUONEB) 0.5-2.5 (3) MG/3ML nebulizer solution 3 mL (has no administration in time range)  ?albuterol (PROVENTIL) (2.5 MG/3ML) 0.083% nebulizer solution 2.5 mg (has no administration in time range)  ?vancomycin (VANCOCIN) IVPB 1000 mg/200 mL premix (0 mg Intravenous Stopped 05/14/2021 1049)   ?ceFEPIme (MAXIPIME) 2 g in sodium chloride 0.9 % 100 mL IVPB (0 g Intravenous Stopped 05/13/2021 1025)  ?ipratropium-albuterol (DUONEB) 0.5-2.5 (3) MG/3ML nebulizer solution 3 mL (3 mLs Nebulization Given 05/04/2021 0950)  ?fu

## 2021-05-25 NOTE — Progress Notes (Signed)
Patient and spouse instructed on the use of a flutter valve.  Patient is sleepy but spouse states understanding. ?

## 2021-05-25 NOTE — ED Notes (Signed)
Got patient undressed on the monitor did ekg shown to Dr Regenia Skeeter got patient a warm blanket patient is resting with call bell in reach  ?

## 2021-05-25 NOTE — ED Triage Notes (Signed)
Pt BIB [facility] from home, pt called out for SOB and wanted to be transported to the hospital, pt sees a dr here for her lung disease. Pt went to bed last PM with SpO2 98% on 2L, SpO2 last pm fluctuated between 60s-80%. Pt was 91% on 4L via [place], pt placed on 6L via simple mask. Code sepsis activated by EMS, VS- HR 130, RR 44, BP 124/76, temp 98.2. 18g L AC.  ?

## 2021-05-25 NOTE — ED Notes (Signed)
Pt SpO2 70% on room air. Pt placed on 4L via [place], SpO2 94% on 4L.  ?
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
## 2021-05-26 ENCOUNTER — Inpatient Hospital Stay (HOSPITAL_COMMUNITY): Payer: Medicare Other

## 2021-05-26 DIAGNOSIS — I4891 Unspecified atrial fibrillation: Secondary | ICD-10-CM

## 2021-05-26 DIAGNOSIS — J151 Pneumonia due to Pseudomonas: Secondary | ICD-10-CM | POA: Diagnosis not present

## 2021-05-26 DIAGNOSIS — I272 Pulmonary hypertension, unspecified: Secondary | ICD-10-CM | POA: Diagnosis not present

## 2021-05-26 DIAGNOSIS — J471 Bronchiectasis with (acute) exacerbation: Secondary | ICD-10-CM | POA: Diagnosis not present

## 2021-05-26 DIAGNOSIS — J9611 Chronic respiratory failure with hypoxia: Secondary | ICD-10-CM | POA: Diagnosis not present

## 2021-05-26 DIAGNOSIS — J9601 Acute respiratory failure with hypoxia: Secondary | ICD-10-CM | POA: Diagnosis not present

## 2021-05-26 LAB — CBC
HCT: 37 % (ref 36.0–46.0)
Hemoglobin: 11.1 g/dL — ABNORMAL LOW (ref 12.0–15.0)
MCH: 29.8 pg (ref 26.0–34.0)
MCHC: 30 g/dL (ref 30.0–36.0)
MCV: 99.2 fL (ref 80.0–100.0)
Platelets: 188 10*3/uL (ref 150–400)
RBC: 3.73 MIL/uL — ABNORMAL LOW (ref 3.87–5.11)
RDW: 14.4 % (ref 11.5–15.5)
WBC: 5.7 10*3/uL (ref 4.0–10.5)
nRBC: 0 % (ref 0.0–0.2)

## 2021-05-26 LAB — BLOOD GAS, ARTERIAL
Acid-Base Excess: 16.3 mmol/L — ABNORMAL HIGH (ref 0.0–2.0)
Bicarbonate: 45.7 mmol/L — ABNORMAL HIGH (ref 20.0–28.0)
O2 Saturation: 98.7 %
Patient temperature: 37
pCO2 arterial: 79 mmHg (ref 32–48)
pH, Arterial: 7.37 (ref 7.35–7.45)
pO2, Arterial: 86 mmHg (ref 83–108)

## 2021-05-26 LAB — TSH: TSH: 0.931 u[IU]/mL (ref 0.350–4.500)

## 2021-05-26 LAB — BASIC METABOLIC PANEL
Anion gap: 3 — ABNORMAL LOW (ref 5–15)
BUN: 12 mg/dL (ref 8–23)
CO2: 38 mmol/L — ABNORMAL HIGH (ref 22–32)
Calcium: 8.7 mg/dL — ABNORMAL LOW (ref 8.9–10.3)
Chloride: 93 mmol/L — ABNORMAL LOW (ref 98–111)
Creatinine, Ser: 0.41 mg/dL — ABNORMAL LOW (ref 0.44–1.00)
GFR, Estimated: >60 mL/min (ref >60)
Glucose, Bld: 138 mg/dL — ABNORMAL HIGH (ref 70–99)
Potassium: 4.3 mmol/L (ref 3.5–5.1)
Sodium: 134 mmol/L — ABNORMAL LOW (ref 135–145)

## 2021-05-26 LAB — LEGIONELLA PNEUMOPHILA SEROGP 1 UR AG: L. pneumophila Serogp 1 Ur Ag: NEGATIVE

## 2021-05-26 LAB — URINE CULTURE: Culture: NO GROWTH

## 2021-05-26 LAB — PROCALCITONIN: Procalcitonin: <0.1 ng/mL

## 2021-05-26 LAB — BRAIN NATRIURETIC PEPTIDE: B Natriuretic Peptide: 364.1 pg/mL — ABNORMAL HIGH (ref 0.0–100.0)

## 2021-05-26 MED ORDER — VORICONAZOLE 200 MG PO TABS
200.0000 mg | ORAL_TABLET | Freq: Two times a day (BID) | ORAL | Status: DC
Start: 2021-05-27 — End: 2021-05-28
  Administered 2021-05-27: 200 mg via ORAL
  Filled 2021-05-26 (×2): qty 1

## 2021-05-26 MED ORDER — ACETYLCYSTEINE 20 % IN SOLN
4.0000 mL | Freq: Three times a day (TID) | RESPIRATORY_TRACT | Status: DC
  Filled 2021-05-26: qty 4

## 2021-05-26 MED ORDER — IPRATROPIUM-ALBUTEROL 0.5-2.5 (3) MG/3ML IN SOLN: 3.0000 mL | Freq: Three times a day (TID) | RESPIRATORY_TRACT | Status: DC

## 2021-05-26 MED ORDER — VORICONAZOLE 200 MG PO TABS
400.0000 mg | ORAL_TABLET | Freq: Two times a day (BID) | ORAL | Status: AC
  Administered 2021-05-27 (×2): 400 mg via ORAL
  Filled 2021-05-26 (×2): qty 2

## 2021-05-26 MED ORDER — BUDESONIDE 0.25 MG/2ML IN SUSP
0.2500 mg | Freq: Two times a day (BID) | RESPIRATORY_TRACT | Status: DC
  Administered 2021-05-26 – 2021-05-28 (×5): 0.25 mg via RESPIRATORY_TRACT
  Filled 2021-05-26 (×5): qty 2

## 2021-05-26 MED ORDER — UMECLIDINIUM BROMIDE 62.5 MCG/ACT IN AEPB
1.0000 | INHALATION_SPRAY | Freq: Every day | RESPIRATORY_TRACT | Status: DC
  Filled 2021-05-26: qty 7

## 2021-05-26 MED ORDER — DILTIAZEM HCL-DEXTROSE 125-5 MG/125ML-% IV SOLN (PREMIX)
5.0000 mg/h | INTRAVENOUS | Status: AC
  Administered 2021-05-26: 5 mg/h via INTRAVENOUS
  Filled 2021-05-26 (×2): qty 125

## 2021-05-26 MED ORDER — ARFORMOTEROL TARTRATE 15 MCG/2ML IN NEBU
15.0000 ug | INHALATION_SOLUTION | Freq: Two times a day (BID) | RESPIRATORY_TRACT | Status: DC
  Administered 2021-05-26 – 2021-05-28 (×4): 15 ug via RESPIRATORY_TRACT
  Filled 2021-05-26 (×4): qty 2

## 2021-05-26 MED ORDER — IPRATROPIUM-ALBUTEROL 0.5-2.5 (3) MG/3ML IN SOLN: 3.0000 mL | RESPIRATORY_TRACT | Status: DC | PRN

## 2021-05-26 MED ORDER — FUROSEMIDE 10 MG/ML IJ SOLN
40.0000 mg | Freq: Three times a day (TID) | INTRAMUSCULAR | Status: DC
  Administered 2021-05-26 – 2021-05-28 (×6): 40 mg via INTRAVENOUS
  Filled 2021-05-26 (×6): qty 4

## 2021-05-26 MED ORDER — DILTIAZEM LOAD VIA INFUSION
10.0000 mg | Freq: Once | INTRAVENOUS | Status: AC
  Administered 2021-05-26: 10 mg via INTRAVENOUS
  Filled 2021-05-26: qty 10

## 2021-05-26 MED ORDER — SODIUM CHLORIDE 3 % IN NEBU
4.0000 mL | INHALATION_SOLUTION | Freq: Two times a day (BID) | RESPIRATORY_TRACT | Status: DC
  Filled 2021-05-26: qty 4

## 2021-05-26 MED ORDER — IOHEXOL 350 MG/ML SOLN
50.0000 mL | Freq: Once | INTRAVENOUS | Status: AC | PRN
  Administered 2021-05-26: 50 mL via INTRAVENOUS

## 2021-05-26 MED ORDER — CHLORHEXIDINE GLUCONATE CLOTH 2 % EX PADS: 6.0000 | MEDICATED_PAD | Freq: Every day | CUTANEOUS | Status: DC

## 2021-05-26 MED ORDER — REVEFENACIN 175 MCG/3ML IN SOLN
175.0000 ug | Freq: Every day | RESPIRATORY_TRACT | Status: DC
  Filled 2021-05-26: qty 3

## 2021-05-26 MED ORDER — MUPIROCIN 2 % EX OINT
1.0000 "application " | TOPICAL_OINTMENT | Freq: Two times a day (BID) | CUTANEOUS | Status: DC
  Administered 2021-05-26 – 2021-05-27 (×3): 1 via NASAL
  Filled 2021-05-26 (×3): qty 22

## 2021-05-26 MED ORDER — ACETYLCYSTEINE 20 % IN SOLN
4.0000 mL | Freq: Two times a day (BID) | RESPIRATORY_TRACT | Status: DC
Start: 2021-05-26 — End: 2021-05-27
  Administered 2021-05-26 – 2021-05-27 (×3): 4 mL via RESPIRATORY_TRACT
  Filled 2021-05-26 (×4): qty 4

## 2021-05-26 NOTE — Consult Note (Signed)
? ?CONSULTATION NOTE  ? ?Patient Name: Jaime Fitzpatrick ?Date of Encounter: 05/26/2021 ?Cardiologist: None ?Electrophysiologist: None ?Advanced Heart Failure: None ? ? ?Chief Complaint  ? ?AFib ? ?Patient Profile  ? ?81 yo female with COPD/Bronchiectasis, admitted with pneumonia and noted to go into afib with RVR ? ?HPI  ? ?[name] is a 81 y.o. female who is being seen today for the evaluation of afib with RVR at the request of Dr. Sloan Leiter. This is a pleasant 81 year old female who is seen today for the evaluation of A-fib.  I was asked to consult for new onset atrial fibrillation in the setting of pneumonia.  She has a history of COPD and bronchiectasis with chronic respiratory failure and has already been seen by pulmonary.  Her husband is a former Engineer, mining.  She presented with lethargy and altered mental status changes and was found to have pneumonia with possible superimposed heart failure.  BNP was 294.  This evening she was noted to go into A-fib with RVR and acutely decompensated.  She became progressively hypoxic when I was in the room seeing her requiring high flow oxygen via Oxymizer.  She was noted to be in A-fib with RVR.  Diltiazem had been started via IV however her IV infiltrated and she had not received the medication.  She was noted to be very confused and encephalopathic.  She was somewhat combative. ? ?PMHx  ? ?Past Medical History:  ?Diagnosis Date  ? ARDS (adult respiratory distress syndrome) ([place])   ? Bronchiectasis   ? Hypertension   ? 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 Nephrolithiasis   ? Palpitations   ? Pneumococcal pneumonia ([place])   ? Reflux   ? Shortness of breath dyspnea   ? going up stairs  ? ? ?Past Surgical History:  ?Procedure Laterality Date  ? COLONOSCOPY    ? MASS EXCISION Right 08/05/2014  ? Procedure: EXCISION MASS POSTERIOR HIP;  Surgeon: Armandina Gemma, MD;  Location: [place] [facility];  Service: General;  Laterality: Right;  ? NASAL SINUS SURGERY     ? TONSILLECTOMY    ? ? ?FAMHx  ? ?Family History  ?Problem Relation Age of Onset  ? Heart disease Father   ? Allergies Father   ? ? ?SOCHx  ? ? reports that she quit smoking about 55 years ago. Her smoking use included cigarettes. She has a 0.10 pack-year smoking history. She has never used smokeless tobacco. No history on file for alcohol use and drug use. ? ?Outpatient Medications  ? ?No current facility-administered medications on file prior to encounter.  ? ?Current Outpatient Medications on File Prior to Encounter  ?Medication Sig Dispense Refill  ? albuterol (VENTOLIN HFA) 108 (90 Base) MCG/ACT inhaler Inhale 2 puffs into the lungs every 6 (six) hours as needed for wheezing or shortness of breath. 20.1 g 1  ? antiseptic oral rinse (BIOTENE) LIQD 15 mLs by Mouth Rinse route daily as needed for dry mouth.    ? Ascorbic Acid (VITAMIN C PO) Take 500 mg by mouth daily.    ? b complex vitamins tablet Take 1 tablet by mouth daily.    ? budesonide-formoterol (SYMBICORT) 80-4.5 MCG/ACT inhaler INHALE 2 PUFFS BY MOUTH EVERY MORNING THEN 2 PUFFS 12 HOURS LATER (Patient taking differently: Inhale 2 puffs into the lungs 2 (two) times daily.) 10.2 g 11  ? Cholecalciferol (VITAMIN D PO) Take 1,000 Units by mouth daily.    ? Coenzyme Q10 (COQ10) 100  MG CAPS Take 100 mg by mouth daily.    ? Glucosamine-Chondroit-Vit C-Mn (GLUCOSAMINE 1500 COMPLEX PO) Take 1 tablet by mouth 2 (two) times daily.    ? guaiFENesin (MUCINEX) 600 MG 12 hr tablet Take 1,200 mg by mouth 2 (two) times daily.    ? methenamine (MANDELAMINE) 1 g tablet Take 1,000 mg by mouth daily.    ? Multiple Vitamins-Calcium (MULTI-DAY/CALCIUM/EXTRA IRON) TABS Take 1 tablet by mouth daily.    ? Probiotic Product (PROBIOTIC PO) Take 1 capsule by mouth daily.    ? triamcinolone (NASACORT) 55 MCG/ACT AERO nasal inhaler Place 1 spray into the nose daily.    ? triamcinolone cream (KENALOG) 0.1 % Apply 1 application. topically 2 (two) times daily as needed for rash.    ?  levofloxacin (LEVAQUIN) 500 MG tablet Take 1 tablet (500 mg total) by mouth daily. 5 tablet 0  ? Misc. Devices (ACAPELLA) MISC Use as directed 1 each 0  ? Respiratory Therapy Supplies (FLUTTER) DEVI Use as directed (Patient not taking: Reported on 05/09/2021) 1 each 0  ? telmisartan (MICARDIS) 80 MG tablet Take 80 mg by mouth daily. (Patient not taking: Reported on 05/09/2021)    ? ? ?Inpatient Medications  ?  ?Scheduled Meds: ? acetylcysteine  4 mL Nebulization BID  ? acidophilus  1 capsule Oral Daily  ? arformoterol  15 mcg Nebulization BID  ? budesonide (PULMICORT) nebulizer solution  0.25 mg Nebulization BID  ? [START ON 05/27/2021] Chlorhexidine Gluconate Cloth  6 each Topical Q0600  ? cholecalciferol  1,000 Units Oral Daily  ? enoxaparin (LOVENOX) injection  40 mg Subcutaneous Q24H  ? furosemide  40 mg Intravenous Q8H  ? guaiFENesin  1,200 mg Oral BID  ? irbesartan  300 mg Oral Daily  ? methenamine  1,000 mg Oral BID  ? multivitamin with minerals  1 tablet Oral Daily  ? mupirocin ointment  1 application. Nasal BID  ? predniSONE  40 mg Oral Q breakfast  ? triamcinolone  1 spray Nasal Daily  ? umeclidinium bromide  1 puff Inhalation Daily  ? voriconazole  400 mg Oral Q12H  ? Followed by  ? [START ON 05/27/2021] voriconazole  200 mg Oral Q12H  ? ? ?Continuous Infusions: ? ceFEPime (MAXIPIME) IV 2 g (05/26/21 1104)  ? diltiazem (CARDIZEM) infusion 5 mg/hr (05/26/21 2001)  ? vancomycin 500 mg (05/26/21 1100)  ? ? ?PRN Meds: ?ipratropium-albuterol  ? ?ALLERGIES  ? ?Allergies  ?Allergen Reactions  ? Acetaminophen-Codeine Itching  ? Amoxicillin Itching  ? Ampicillin Itching  ? Avelox [Moxifloxacin] Hives and Dermatitis  ? Codeine   ?  Other reaction(s): Other (See Comments), Unknown ?Asthma ?Codeine with Tyenol patient states "I am able to take plain codeine ?  ? Erythromycin Ethylsuccinate Hives  ? Lorazepam   ?  Other reaction(s): in effective for sleep  ? ? ?ROS  ? ?Pertinent items noted in HPI and remainder of  comprehensive ROS otherwise negative. ? ?Vitals  ? ?Vitals:  ? 05/26/21 2033 05/26/21 2042 05/26/21 2044 05/26/21 2059  ?BP: 129/71 117/75 (!) 115/95   ?Pulse: (!) 104 93 (!) 120 97  ?Resp: 20 (!) 31 (!) 24 19  ?Temp:      ?TempSrc:      ?SpO2: 99% 100% 92% 97%  ? ? ?Intake/Output Summary (Last 24 hours) at 05/26/2021 2152 ?Last data filed at 05/26/2021 2144 ?Gross per 24 hour  ?Intake 655.03 ml  ?Output 2900 ml  ?Net -2244.97 ml  ? ?There were no  vitals filed for this visit. ? ?Physical Exam  ? ?General appearance: alert, combative, moderate distress, and pale ?Neck: JVD - 4 cm above sternal notch, no carotid bruit, and thyroid not enlarged, symmetric, no tenderness/mass/nodules ?Lungs: diminished breath sounds bilaterally, rales bilaterally, and rhonchi bibasilar ?Heart: irregularly irregular rhythm and tachycardic ?Abdomen: scaphoid, soft, non-tender ?Extremities: pale, cool, dry ?Pulses: 2+ and symmetric ?Skin: pale ?Neurologic: Mental status: confused, combative ?Psych: Cannot assess ? ?Labs  ? ?Results for orders placed or performed during the hospital encounter of 05/11/2021 (from the past 48 hour(s))  ?Lactic acid, plasma     Status: None  ? Collection Time: 05/19/2021  9:04 AM  ?Result Value Ref Range  ? Lactic Acid, Venous 0.8 0.5 - 1.9 mmol/L  ?  Comment: Performed at [facility], [place] [address], [place], [place] [address]  ?Urinalysis, Routine w reflex microscopic Urine, Clean Catch     Status: None  ? Collection Time: 05/21/2021  9:04 AM  ?Result Value Ref Range  ? Color, Urine YELLOW YELLOW  ? APPearance CLEAR CLEAR  ? Specific Gravity, Urine 1.013 1.005 - 1.030  ? pH 5.0 5.0 - 8.0  ? Glucose, UA NEGATIVE NEGATIVE mg/dL  ? Hgb urine dipstick NEGATIVE NEGATIVE  ? Bilirubin Urine NEGATIVE NEGATIVE  ? Ketones, ur NEGATIVE NEGATIVE mg/dL  ? Protein, ur NEGATIVE NEGATIVE mg/dL  ? Nitrite NEGATIVE NEGATIVE  ? Leukocytes,Ua NEGATIVE NEGATIVE  ?  Comment: Performed at [facility], [place] [address]  [place], [place] [address]  ?Strep pneumoniae urinary antigen     Status: None  ? Collection Time: 05/27/2021  9:04 AM  ?Result Value Ref Range  ? Strep Pneumo Urinary Antigen NEGATIVE NEGATIVE  ?  Comment:
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
## 2021-05-26 NOTE — Significant Event (Signed)
Rapid Response Event Note  ? ?Reason for Call :  ?MEWS-6(HR-125, RR-40s) ? ?Initial Focused Assessment:  ?Pt lying in bed with eyes open. She is confused but alert to person and place. Pt is in resp distress with pursed lip breathing. Lungs with scattered crackles t/o. Skin cool to touch. ? ?T-97.6, HR-122, BP-129/84, RR-35, SpO2-85% on 30L 100% HHFNC.  ? ?Pt changed to NRB d/t mouth breathing-SpO2 increased to 98% and pursed-lip breathing improved. ? ?PIV is infiltrated and cardizem gtt isn't going. New PIV placed, pt given '40mg'$  lasix IV, and cardizem gtt restarted. IV team consulted for additional PIV.  ? ? ?Interventions:  ?HHFNC>NRB ?New PIV placed: '40mg'$  lasix given, cardizem gtt restarted. ?IV team to start additional PIV ?ABG-7.37/79/86/45.7 ?Plan of Care:  ?Allow time for lasix and cardizem to work. Place pt back on HHFNC as SpO2/WOB allow. Continue to monitor pt closely. Call RRT if further assistance needed.  ? ?Event Summary:  ? ?MD Notified: Dr. Debara Pickett at bedside on my arrival, Dr. Bridgett Larsson notified by bedside RN.  ?Call Time:2010 ?Arrival Time:2012 ?End Time:2106 ? ?Dillard Essex, RN ?

## 2021-05-26 NOTE — Progress Notes (Signed)
CPT not done pt is in the middle of a rapid response. ?

## 2021-05-26 NOTE — Progress Notes (Signed)
?      ?                 PROGRESS NOTE ? ?      ?PATIENT DETAILS ?Name: Jaime Fitzpatrick ?Age: 81 y.o. ?Sex: female ?Date of Birth: 1940/12/10 ?Admit Date: 05/23/2021 ?Admitting Physician Lequita Halt, MD ?[id number], Ravisankar, MD ? ?Brief Summary: ?Patient is a 81 y.o.  female with history of bronchiectasis, Gold stage II COPD, chronic hypoxic respiratory failure on 2 L of oxygen (with activity/sleep)-presenting with worsening cough/shortness of breath x2 weeks (failed outpatient treatment with Levaquin)-found to have acute hypoxic respiratory failure due to bronchiectasis flare with PNA.. ? ?Significant events: ?4/27>> admit to [facility] for worsening shortness of breath-hypoxemia-due to bronchiectasis//PNA. ?4/28>> worsening hypoxemia-on heated high flow. ? ?Significant studies: ?4/28>> CTA chest: No PE, enlarged pulmonary artery, bilateral lower lobe collapse/consolidation, patchy nodular groundglass/consolidative opacity in the upper lobes bilaterally. ? ?Significant microbiology data: ?4/27>> flu/influenza PCR: Negative ?4/27>> sputum culture: Pending ?4/27>> blood culture: No growth ? ?Procedures: ?None ? ?Consults: ?PCCM ? ?Subjective: ?Anxious-keeps on taking her oxygen off.  Appears comfortable. ? ?Objective: ?Vitals: ?Blood pressure (!) 155/83, pulse 100, temperature 99 ?F (37.2 ?C), temperature source Oral, resp. rate (!) 22, SpO2 91 %.  ? ?Exam: ?Gen Exam:Alert awake-not in any distress ?HEENT:atraumatic, normocephalic ?Chest: Coarse breath sounds all over.  Scattered rhonchi-mostly bibasilar rales. ?CVS:S1S2 regular ?Abdomen:soft non tender, non distended ?Extremities:no edema ?Neurology: Non focal ?Skin: no rash ? ?Pertinent Labs/Radiology: ? ?  Latest Ref Rng & Units 05/26/2021  ?  2:36 AM 05/24/2021  ?  1:39 PM 05/18/2021  ?  9:19 AM  ?CBC  ?WBC 4.0 - 10.5 K/uL 5.7    6.3    ?Hemoglobin 12.0 - 15.0 g/dL 11.1   11.9   11.0    ?Hematocrit 36.0 - 46.0 % 37.0   35.0   37.3    ?Platelets 150 - 400 K/uL 188    200     ?  ?Lab Results  ?Component Value Date  ? NA 134 (L) 05/26/2021  ? K 4.3 05/26/2021  ? CL 93 (L) 05/26/2021  ? CO2 38 (H) 05/26/2021  ?  ? ? ?Assessment/Plan: ?Acute on chronic hypoxic respiratory failure due to bilateral PNA/bronchiectasis flare/COPD exacerbation: Hypoxia has worsened overnight-now on heated high flow-CTA chest negative for PE but shows multiple infiltrates/bilateral lower lobe collapse and other changes consistent with bronchiectasis.  Discussed with pulmonology-continue IV antibiotics steroids/bronchodilators-needs aggressive chest PT-have ordered accordingly.  Although hypoxia has worsened-she appears comfortable.  Continue to monitor closely and titrate down FiO2 as tolerated. ? ?Severe pulmonary hypertension: Chronic issue-likely sequently of underlying bronchiectasis. ? ?HTN: BP stable-continue ARB ? ?BMI ?Estimated body mass index is 20.7 kg/m? as calculated from the following: ?  Height as of 05/18/21: '5\' 9"'$  (1.753 m). ?  Weight as of 05/18/21: 63.6 kg.  ? ?Code status: ?  Code Status: Full Code  ? ?DVT Prophylaxis: ?enoxaparin (LOVENOX) injection 40 mg Start: 05/24/2021 1300 ?  ?Family Communication: Spouse at bedside ? ? ?Disposition Plan: ?Status is: Inpatient ?Remains inpatient appropriate because: Worsening hypoxemia-on heated high flow-on antibiotics/bronchodilators/chest PT-see above.  Not yet stable for discharge. ?  ?Planned Discharge Destination:Home health ? ? ?Diet: ?Diet Order   ? ?       ?  Diet 2 gram sodium Room service appropriate? Yes; Fluid consistency: Thin  Diet effective now       ?  ? ?  ?  ? ?  ?  ? ? ?  Antimicrobial agents: ?Anti-infectives (From admission, onward)  ? ? Start     Dose/Rate Route Frequency Ordered Stop  ? 05/24/2021 2200  ceFEPIme (MAXIPIME) 2 g in sodium chloride 0.9 % 100 mL IVPB       ? 2 g ?200 mL/hr over 30 Minutes Intravenous Every 12 hours 04/29/2021 1027    ? 05/17/2021 2200  vancomycin (VANCOREADY) IVPB 500 mg/100 mL       ? 500 mg ?100 mL/hr over 60  Minutes Intravenous Every 12 hours 05/19/2021 1027    ? 05/04/2021 1445  methenamine (MANDELAMINE) tablet 1,000 mg       ? 1,000 mg Oral 2 times daily 04/30/2021 1139    ? 05/17/2021 0915  vancomycin (VANCOCIN) IVPB 1000 mg/200 mL premix       ? 1,000 mg ?200 mL/hr over 60 Minutes Intravenous  Once 05/24/2021 0907 05/22/2021 1049  ? 05/11/2021 0915  ceFEPIme (MAXIPIME) 2 g in sodium chloride 0.9 % 100 mL IVPB       ? 2 g ?200 mL/hr over 30 Minutes Intravenous  Once 05/24/2021 0907 05/15/2021 1025  ? ?  ? ? ? ?MEDICATIONS: ?Scheduled Meds: ? acidophilus  1 capsule Oral Daily  ? cholecalciferol  1,000 Units Oral Daily  ? enoxaparin (LOVENOX) injection  40 mg Subcutaneous Q24H  ? fluticasone furoate-vilanterol  1 puff Inhalation Daily  ? guaiFENesin  1,200 mg Oral BID  ? ipratropium-albuterol  3 mL Nebulization Q6H  ? irbesartan  300 mg Oral Daily  ? methenamine  1,000 mg Oral BID  ? multivitamin with minerals  1 tablet Oral Daily  ? predniSONE  40 mg Oral Q breakfast  ? triamcinolone  1 spray Nasal Daily  ? ?Continuous Infusions: ? ceFEPime (MAXIPIME) IV 2 g (05/04/2021 2200)  ? vancomycin 500 mg (05/26/21 0046)  ? ?PRN Meds:.albuterol ? ? ?I have personally reviewed following labs and imaging studies ? ?LABORATORY DATA: ?CBC: ?Recent Labs  ?Lab 05/26/2021 ?0919 04/30/2021 ?1339 05/26/21 ?0236  ?WBC 6.3  --  5.7  ?NEUTROABS 4.5  --   --   ?HGB 11.0* 11.9* 11.1*  ?HCT 37.3 35.0* 37.0  ?MCV 99.2  --  99.2  ?PLT 200  --  188  ? ? ?Basic Metabolic Panel: ?Recent Labs  ?Lab 05/21/2021 ?0919 05/05/2021 ?1339 05/26/21 ?0236  ?NA 138 134* 134*  ?K 4.2 4.6 4.3  ?CL 98  --  93*  ?CO2 35*  --  38*  ?GLUCOSE 98  --  138*  ?BUN 10  --  12  ?CREATININE 0.50  --  0.41*  ?CALCIUM 8.4*  --  8.7*  ? ? ?GFR: ?Estimated Creatinine Clearance: 56.3 mL/min (A) (by C-G formula based on SCr of 0.41 mg/dL (L)). ? ?Liver Function Tests: ?Recent Labs  ?Lab 05/09/2021 ?0919  ?AST 19  ?ALT 15  ?ALKPHOS 57  ?BILITOT 0.3  ?PROT 6.7  ?ALBUMIN 2.9*  ? ?No results for input(s):  LIPASE, AMYLASE in the last 168 hours. ?No results for input(s): AMMONIA in the last 168 hours. ? ?Coagulation Profile: ?Recent Labs  ?Lab 05/21/2021 ?0919  ?INR 1.1  ? ? ?Cardiac Enzymes: ?No results for input(s): CKTOTAL, CKMB, CKMBINDEX, TROPONINI in the last 168 hours. ? ?BNP (last 3 results) ?Recent Labs  ?  05/09/21 ?1618  ?PROBNP 262.0*  ? ? ?Lipid Profile: ?No results for input(s): CHOL, HDL, LDLCALC, TRIG, CHOLHDL, LDLDIRECT in the last 72 hours. ? ?Thyroid Function Tests: ?No results for input(s): TSH, T4TOTAL, FREET4, T3FREE, THYROIDAB in  the last 72 hours. ? ?Anemia Panel: ?No results for input(s): VITAMINB12, FOLATE, FERRITIN, TIBC, IRON, RETICCTPCT in the last 72 hours. ? ?Urine analysis: ?   ?Component Value Date/Time  ? COLORURINE YELLOW 05/16/2021 0904  ? APPEARANCEUR CLEAR 05/16/2021 0904  ? LABSPEC 1.013 05/15/2021 0904  ? PHURINE 5.0 05/27/2021 0904  ? GLUCOSEU NEGATIVE 05/06/2021 0904  ? [place] NEGATIVE 05/20/2021 0904  ? [place] NEGATIVE 05/04/2021 0904  ? [place] NEGATIVE 05/15/2021 0904  ? [place] NEGATIVE 05/02/2021 0904  ? NITRITE NEGATIVE 05/24/2021 0904  ? LEUKOCYTESUR NEGATIVE 05/24/2021 0904  ? ? ?Sepsis Labs: ?Lactic Acid, Venous ?   ?Component Value Date/Time  ? LATICACIDVEN 0.8 05/24/2021 0904  ? ? ?MICROBIOLOGY: ?Recent Results (from the past 240 hour(s))  ?Resp Panel by RT-PCR (Flu A&B, Covid) Nasopharyngeal Swab     Status: None  ? Collection Time: 05/03/2021  9:19 AM  ? Specimen: Nasopharyngeal Swab; Nasopharyngeal(NP) swabs in vial transport medium  ?Result Value Ref Range Status  ? SARS Coronavirus 2 by RT PCR NEGATIVE NEGATIVE Final  ?  Comment: (NOTE) ?SARS-CoV-2 target nucleic acids are NOT DETECTED. ? ?The SARS-CoV-2 RNA is generally detectable in upper respiratory ?specimens during the acute phase of infection. The lowest ?concentration of SARS-CoV-2 viral copies this assay can detect is ?138 copies/mL. A negative result does not preclude SARS-Cov-2 ?infection and  should not be used as the sole basis for treatment or ?other patient management decisions. A negative result may occur with  ?improper specimen collection/handling, submission of specimen other ?than nasoph

## 2021-05-26 NOTE — Progress Notes (Signed)
RT @ bedside for rapid response called pt went into AFIB. PT was placed on NRB mask @ 100% while lines was being established. PT will desat while talking and being worked on. ABG done and sent to the lab waiting on results. ?

## 2021-05-26 NOTE — Progress Notes (Signed)
Cardialogist in the room to see the patient. ?

## 2021-05-26 NOTE — Progress Notes (Signed)
[facility] ? 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  He will discuss further with their son. ? ?Roselie Awkward, MD ?[facility] ?Pager: [phone] ?Cell: [phone] ?After 7:00 pm call Elink  [phone] ? ?

## 2021-05-26 NOTE — Consult Note (Incomplete)
?Cardiology Consultation:  ? ?Patient ID: [place] ?MRN: [id number]; DOB: 1940-06-04 ? ?Admit date: 05/27/2021 ?Date of Consult: 05/26/2021 ? ?PCP:  Prince Solian, MD ?  ?[facility] Providers ?Cardiologist:  None    ? ? ?Patient Profile:  ? ?Jaime Fitzpatrick is a 81 y.o. female with a hx of COPD Gold stage II, hypertension, bronchiectasis, hypertension who is being seen 05/26/2021 for the evaluation of pulmonary hypertension at the request of Dr. Sloan Leiter. ? ?History of Present Illness:  ? ?Jaime Fitzpatrick is an 81 year old female with above medical history.  Per chart review, patient has a distant history of being seen by Cardiology in 2011 for evaluation of palpitations.  Her palpitations were thought to be caused by PVCs, and patient has not been seen by cardiology since.  However, patient has been followed closely by pulmonology due to her COPD, chronic respiratory failure, bronchiectasis.  Patient had an echocardiogram done on 05/22/21 showed moderately reduced RV systolic function, mildly enlarged RV size, severely elevated pulmonary artery systolic pressure with a dilated pulmonary artery.  LVEF 19-37%, grade 1 diastolic dysfunction, moderate concentric LVH, mild-moderately dilated left atrium, severely dilated right atrium. ? ?Patient presented to the ED via EMS on 05/22/2021 complaining of SOB. EMS noted her SpO2 was 91% on 4L via nasal cannula. Patient was admitted to hospitalist service for treatment of acute hypoxic respiratory failure, bacterial pneumonia, acute COPD exacerbation, bronchiectasis, severe pulmonary hypertension. ? ? ?Past Medical History:  ?Diagnosis Date  ? ARDS (adult respiratory distress syndrome) ([place])   ? Bronchiectasis   ? Hypertension   ? 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 Nephrolithiasis   ? Palpitations   ? Pneumococcal pneumonia ([place])   ? Reflux   ? Shortness of breath dyspnea   ? going up stairs  ? ? ?Past Surgical History:  ?Procedure Laterality Date  ? COLONOSCOPY     ? MASS EXCISION Right 08/05/2014  ? Procedure: EXCISION MASS POSTERIOR HIP;  Surgeon: Armandina Gemma, MD;  Location: [facility];  Service: General;  Laterality: Right;  ? NASAL SINUS SURGERY    ? TONSILLECTOMY    ?  ? ?{Home Medications (Optional):21181} ? ?Inpatient Medications: ?Scheduled Meds: ? acetylcysteine  4 mL Nebulization BID  ? acidophilus  1 capsule Oral Daily  ? arformoterol  15 mcg Nebulization BID  ? budesonide (PULMICORT) nebulizer solution  0.25 mg Nebulization BID  ? [START ON 05/27/2021] Chlorhexidine Gluconate Cloth  6 each Topical Q0600  ? cholecalciferol  1,000 Units Oral Daily  ? diltiazem  10 mg Intravenous Once  ? enoxaparin (LOVENOX) injection  40 mg Subcutaneous Q24H  ? furosemide  40 mg Intravenous Q8H  ? guaiFENesin  1,200 mg Oral BID  ? irbesartan  300 mg Oral Daily  ? methenamine  1,000 mg Oral BID  ? multivitamin with minerals  1 tablet Oral Daily  ? mupirocin ointment  1 application. Nasal BID  ? predniSONE  40 mg Oral Q breakfast  ? triamcinolone  1 spray Nasal Daily  ? umeclidinium bromide  1 puff Inhalation Daily  ? voriconazole  400 mg Oral Q12H  ? Followed by  ? [START ON 05/27/2021] voriconazole  200 mg Oral Q12H  ? ?Continuous Infusions: ? ceFEPime (MAXIPIME) IV 2 g (05/26/21 1104)  ? diltiazem (CARDIZEM) infusion    ? vancomycin 500 mg (05/26/21 1100)  ? ?PRN Meds: ?ipratropium-albuterol ? ?Allergies:    ?Allergies  ?Allergen Reactions  ? Acetaminophen-Codeine Itching  ? Amoxicillin  Itching  ? Ampicillin Itching  ? Avelox [Moxifloxacin] Hives and Dermatitis  ? Codeine   ?  Other reaction(s): Other (See Comments), Unknown ?Asthma ?Codeine with Tyenol patient states "I am able to take plain codeine ?  ? Erythromycin Ethylsuccinate Hives  ? Lorazepam   ?  Other reaction(s): in effective for sleep  ? ? ?Social History:   ?Social History  ? ?Socioeconomic History  ? Marital status: Married  ?  Spouse name: Not on file  ? Number of children: Not on file  ? Years of  education: Not on file  ? Highest education level: Not on file  ?Occupational History  ? Occupation: homemaker  ?Tobacco Use  ? Smoking status: Former  ?  Packs/day: 0.10  ?  Years: 1.00  ?  Pack years: 0.10  ?  Types: Cigarettes  ?  Quit date: 01/29/1966  ?  Years since quitting: 55.3  ? Smokeless tobacco: Never  ? Tobacco comments:  ?  only smoked 1 year socially.   ?Substance and Sexual Activity  ? Alcohol use: Not on file  ? Drug use: Not on file  ? Sexual activity: Not on file  ?Other Topics Concern  ? Not on file  ?Social History Narrative  ? Not on file  ? ?Social Determinants of Health  ? ?Financial Resource Strain: Not on file  ?Food Insecurity: Not on file  ?Transportation Needs: Not on file  ?Physical Activity: Not on file  ?Stress: Not on file  ?Social Connections: Not on file  ?Intimate Partner Violence: Not on file  ?  ?Family History:   ?*** ?Family History  ?Problem Relation Age of Onset  ? Heart disease Father   ? Allergies Father   ?  ? ?ROS:  ?Please see the history of present illness.  ?*** ?All other ROS reviewed and negative.    ? ?Physical Exam/Data:  ? ?Vitals:  ? 05/26/21 1219 05/26/21 1220 05/26/21 1501 05/26/21 1706  ?BP: (!) 142/77  (!) 142/77 (!) 149/87  ?Pulse: 94   92  ?Resp: 16  (!) 22 20  ?Temp:    97.7 ?F (36.5 ?C)  ?TempSrc:    Oral  ?SpO2: 95% 95%  97%  ? ? ?Intake/Output Summary (Last 24 hours) at 05/26/2021 1912 ?Last data filed at 05/26/2021 1637 ?Gross per 24 hour  ?Intake 655.03 ml  ?Output 2000 ml  ?Net -1344.97 ml  ? ? ?  05/18/2021  ? 10:58 AM 05/09/2021  ?  2:47 PM 07/04/2020  ?  4:32 PM  ?Last 3 Weights  ?Weight (lbs) 140 lb 3.2 oz 140 lb 6.4 oz 134 lb  ?Weight (kg) 63.594 kg 63.685 kg 60.782 kg  ?   ?There is no height or weight on file to calculate BMI.  ?General:  Well nourished, well developed, in no acute distress*** ?HEENT: normal ?Neck: no JVD ?Vascular: No carotid bruits; Distal pulses 2+ bilaterally ?Cardiac:  normal S1, S2; RRR; no murmur *** ?Lungs:  clear to  auscultation bilaterally, no wheezing, rhonchi or rales  ?Abd: soft, nontender, no hepatomegaly  ?Ext: no edema ?Musculoskeletal:  No deformities, BUE and BLE strength normal and equal ?Skin: warm and dry  ?Neuro:  CNs 2-12 intact, no focal abnormalities noted ?Psych:  Normal affect  ? ?EKG:  The EKG was personally reviewed and demonstrates:  *** ?Telemetry:  Telemetry was personally reviewed and demonstrates:  *** ? ?Relevant CV Studies: ?*** ? ?Laboratory Data: ? ?High Sensitivity Troponin:  No results for input(s): TROPONINIHS  in the last 720 hours.   ?Chemistry ?Recent Labs  ?Lab 05/09/2021 ?0919 05/19/2021 ?1339 05/26/21 ?0236  ?NA 138 134* 134*  ?K 4.2 4.6 4.3  ?CL 98  --  93*  ?CO2 35*  --  38*  ?GLUCOSE 98  --  138*  ?BUN 10  --  12  ?CREATININE 0.50  --  0.41*  ?CALCIUM 8.4*  --  8.7*  ?GFRNONAA >60  --  >60  ?ANIONGAP 5  --  3*  ?  ?Recent Labs  ?Lab 04/29/2021 ?0919  ?PROT 6.7  ?ALBUMIN 2.9*  ?AST 19  ?ALT 15  ?ALKPHOS 57  ?BILITOT 0.3  ? ?Lipids No results for input(s): CHOL, TRIG, HDL, LABVLDL, LDLCALC, CHOLHDL in the last 168 hours.  ?Hematology ?Recent Labs  ?Lab 05/14/2021 ?0919 05/18/2021 ?1339 05/26/21 ?0236  ?WBC 6.3  --  5.7  ?RBC 3.76*  --  3.73*  ?HGB 11.0* 11.9* 11.1*  ?HCT 37.3 35.0* 37.0  ?MCV 99.2  --  99.2  ?MCH 29.3  --  29.8  ?MCHC 29.5*  --  30.0  ?RDW 14.6  --  14.4  ?PLT 200  --  188  ? ?Thyroid No results for input(s): TSH, FREET4 in the last 168 hours.  ?BNP ?Recent Labs  ?Lab 05/19/2021 ?0919 05/26/21 ?0236  ?BNP 294.3* 364.1*  ?  ?DDimer No results for input(s): DDIMER in the last 168 hours. ? ? ?Radiology/Studies:  ?CT Angio Chest Pulmonary Embolism (PE) W or WO Contrast ? ?Result Date: 05/26/2021 ?CLINICAL DATA:  Pulmonary embolism suspected. EXAM: CT ANGIOGRAPHY CHEST WITH CONTRAST TECHNIQUE: Multidetector CT imaging of the chest was performed using the standard protocol during bolus administration of intravenous contrast. Multiplanar CT image reconstructions and MIPs were obtained to  evaluate the vascular anatomy. RADIATION DOSE REDUCTION: This exam was performed according to the departmental dose-optimization program which includes automated exposure control, adjustment of the mA and/or kV accordi

## 2021-05-26 NOTE — Progress Notes (Signed)
Pharmacy Antibiotic Note ? ?Jaime Fitzpatrick is a 81 y.o. female admitted on 05/20/2021 with pneumonia.  Pharmacy consulted on 4/27 for cefepime and vancomycin dosing for PNA.  ?Pharmacy has been consulted 4/28 by PCCM to add Voriconazole dosing for fungal infection see on CT imaging.  ?Weight 63.6 kg ?Taking po meds currently. ? ? ?Plan: ?Voriconazole '400mg'$  po q12hr x 2 doses then '200mg'$  po q12hr.  ? ?  ? ?Temp (24hrs), Avg:98.3 ?F (36.8 ?C), Min:97.6 ?F (36.4 ?C), Max:99 ?F (37.2 ?C) ? ?Recent Labs  ?Lab 05/23/2021 ?0904 05/04/2021 ?0919 05/26/21 ?0236  ?WBC  --  6.3 5.7  ?CREATININE  --  0.50 0.41*  ?LATICACIDVEN 0.8  --   --   ?  ?Estimated Creatinine Clearance: 56.3 mL/min (A) (by C-G formula based on SCr of 0.41 mg/dL (L)).   ? ?Allergies  ?Allergen Reactions  ? Acetaminophen-Codeine Itching  ? Amoxicillin Itching  ? Ampicillin Itching  ? Avelox [Moxifloxacin] Hives and Dermatitis  ? Codeine   ?  Other reaction(s): Other (See Comments), Unknown ?Asthma ?Codeine with Tyenol patient states "I am able to take plain codeine ?  ? Erythromycin Ethylsuccinate Hives  ? Lorazepam   ?  Other reaction(s): in effective for sleep  ? ? ?Antimicrobials this admission: ? Vanc 4/27 >> ? Cefepime 4/27 >> ? Mandelamine from PTA >> ? Bactroban nasal 4/28 >>(5/2) ?Voriconazol 4/28>> ?  ?  ?Dose adjustments this admission: ? ? ?Microbiology results:  ?4/27 sputum: reincubated ? 4/27 blood: ng < 24 hrs to date ? 4/27 urine: neg ? 4/27 COVID and flu: eng ? 4/27 MRSA PCR: positive ? ? ?Thank you for allowing pharmacy to be a part of this patient?s care. ?Nicole Cella, RPh ?Clinical Pharmacist ?05/26/2021 6:05 PM ? ?

## 2021-05-26 NOTE — Progress Notes (Signed)
Notified Dr. Bridgett Larsson too and he ordered the restrain. Will implement the order as received. ?

## 2021-05-26 NOTE — Progress Notes (Incomplete)
The patient is transitioned from [place] to NRB mask and she's not keeping it on. She was sating in the mid 57 s. She's keeps on pulling the mask off. Her husband is at the bedside holding the mask in place and he's requesting for a hand restrain. Notified PCCM RN and awaiting for a call from the Dr. ?
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
## 2021-05-27 DIAGNOSIS — J151 Pneumonia due to Pseudomonas: Secondary | ICD-10-CM | POA: Diagnosis not present

## 2021-05-27 DIAGNOSIS — I4891 Unspecified atrial fibrillation: Secondary | ICD-10-CM | POA: Diagnosis not present

## 2021-05-27 DIAGNOSIS — J9601 Acute respiratory failure with hypoxia: Secondary | ICD-10-CM | POA: Diagnosis not present

## 2021-05-27 DIAGNOSIS — I272 Pulmonary hypertension, unspecified: Secondary | ICD-10-CM | POA: Diagnosis not present

## 2021-05-27 LAB — BLOOD GAS, ARTERIAL
Acid-Base Excess: 20.9 mmol/L — ABNORMAL HIGH (ref 0.0–2.0)
Bicarbonate: 49.3 mmol/L — ABNORMAL HIGH (ref 20.0–28.0)
Drawn by: 164
O2 Saturation: 87.7 %
Patient temperature: 37
pCO2 arterial: 71 mmHg (ref 32–48)
pH, Arterial: 7.45 (ref 7.35–7.45)
pO2, Arterial: 50 mmHg — ABNORMAL LOW (ref 83–108)

## 2021-05-27 LAB — BASIC METABOLIC PANEL
Anion gap: 6 (ref 5–15)
BUN: 11 mg/dL (ref 8–23)
CO2: 41 mmol/L — ABNORMAL HIGH (ref 22–32)
Calcium: 8.6 mg/dL — ABNORMAL LOW (ref 8.9–10.3)
Chloride: 90 mmol/L — ABNORMAL LOW (ref 98–111)
Creatinine, Ser: 0.45 mg/dL (ref 0.44–1.00)
GFR, Estimated: >60 mL/min (ref >60)
Glucose, Bld: 177 mg/dL — ABNORMAL HIGH (ref 70–99)
Potassium: 3.7 mmol/L (ref 3.5–5.1)
Sodium: 137 mmol/L (ref 135–145)

## 2021-05-27 LAB — CULTURE, RESPIRATORY W GRAM STAIN: Culture: NORMAL

## 2021-05-27 LAB — CBC
HCT: 39.9 % (ref 36.0–46.0)
Hemoglobin: 11.9 g/dL — ABNORMAL LOW (ref 12.0–15.0)
MCH: 29.2 pg (ref 26.0–34.0)
MCHC: 29.8 g/dL — ABNORMAL LOW (ref 30.0–36.0)
MCV: 98 fL (ref 80.0–100.0)
Platelets: 188 10*3/uL (ref 150–400)
RBC: 4.07 MIL/uL (ref 3.87–5.11)
RDW: 13.7 % (ref 11.5–15.5)
WBC: 6.7 10*3/uL (ref 4.0–10.5)
nRBC: 0 % (ref 0.0–0.2)

## 2021-05-27 LAB — PROCALCITONIN: Procalcitonin: <0.1 ng/mL

## 2021-05-27 LAB — BRAIN NATRIURETIC PEPTIDE: B Natriuretic Peptide: 484.1 pg/mL — ABNORMAL HIGH (ref 0.0–100.0)

## 2021-05-27 LAB — MAGNESIUM: Magnesium: 2 mg/dL (ref 1.7–2.4)

## 2021-05-27 MED ORDER — ALBUTEROL SULFATE (2.5 MG/3ML) 0.083% IN NEBU
2.5000 mg | INHALATION_SOLUTION | Freq: Two times a day (BID) | RESPIRATORY_TRACT | Status: DC
Start: 2021-05-27 — End: 2021-05-28
  Administered 2021-05-27 – 2021-05-28 (×3): 2.5 mg via RESPIRATORY_TRACT
  Filled 2021-05-27 (×3): qty 3

## 2021-05-27 MED ORDER — SODIUM CHLORIDE 3 % IN NEBU
4.0000 mL | INHALATION_SOLUTION | Freq: Two times a day (BID) | RESPIRATORY_TRACT | Status: DC
  Administered 2021-05-27 – 2021-05-28 (×2): 4 mL via RESPIRATORY_TRACT
  Filled 2021-05-27 (×2): qty 4

## 2021-05-27 MED ORDER — QUETIAPINE FUMARATE 25 MG PO TABS
25.0000 mg | ORAL_TABLET | Freq: Every day | ORAL | Status: DC
  Administered 2021-05-27: 25 mg via ORAL
  Filled 2021-05-27: qty 1

## 2021-05-27 MED ORDER — APIXABAN 2.5 MG PO TABS: 2.5000 mg | ORAL_TABLET | Freq: Two times a day (BID) | ORAL | Status: DC

## 2021-05-27 MED ORDER — APIXABAN 5 MG PO TABS: 5.0000 mg | ORAL_TABLET | Freq: Two times a day (BID) | ORAL | Status: DC

## 2021-05-27 MED ORDER — DILTIAZEM HCL 60 MG PO TABS
60.0000 mg | ORAL_TABLET | Freq: Three times a day (TID) | ORAL | Status: DC
  Administered 2021-05-27 – 2021-05-28 (×4): 60 mg via ORAL
  Filled 2021-05-27 (×4): qty 1

## 2021-05-27 MED ORDER — APIXABAN 5 MG PO TABS
5.0000 mg | ORAL_TABLET | Freq: Two times a day (BID) | ORAL | Status: DC
  Administered 2021-05-27 (×2): 5 mg via ORAL
  Filled 2021-05-27 (×2): qty 1

## 2021-05-27 NOTE — Evaluation (Signed)
Physical Therapy Evaluation ?Patient Details ?Name: Jaime Fitzpatrick ?MRN: [id number] ?DOB: 28-Feb-1940 ?Today's Date: 05/27/2021 ? ?History of Present Illness ? Pt is a 81 y.o. female persenting 4/27 with worsening cough and shortness of breath for 2 weeks PTA. Admitted for acute hypoxic respiratory failure due to bronchiectasis flare with bilateral PNA. PMH includes: ARDS (~30 years ago), bronchiectasis, COPD, home O2 2L during sleep and activity, reflux, HTN ?  ?Clinical Impression ? Pt admitted with above diagnosis. PTA pt lived at home with husband. Pt is confused and no family present. Unable to obtain further prior status. Pt currently with functional limitations due to the deficits listed below (see PT Problem List). On eval, pt required min assist bed mobility and demonstrated good sitting balance. Unable to progress beyond EOB due to desat. Pt on 25 L HHFNC + 15L NRB. SpO2 92% at rest. Desat to 84% sitting EOB. Pt will benefit from skilled PT to increase their independence and safety with mobility to allow discharge to the venue listed below. Pt may be able to progress to needing no follow up PT services. Will further assess gait and balance as cardiopulmonary status improves.   ?   ?   ? ?Recommendations for follow up therapy are one component of a multi-disciplinary discharge planning process, led by the attending physician.  Recommendations may be updated based on patient status, additional functional criteria and insurance authorization. ? ?Follow Up Recommendations Home health PT ? ?  ?Assistance Recommended at Discharge Frequent or constant Supervision/Assistance  ?Patient can return home with the following ?   ? ?  ?Equipment Recommendations Other (comment) (TBD)  ?Recommendations for Other Services ?    ?  ?Functional Status Assessment Patient has had a recent decline in their functional status and demonstrates the ability to make significant improvements in function in a reasonable and predictable  amount of time.  ? ?  ?Precautions / Restrictions Precautions ?Precautions: Fall;Other (comment) ?Precaution Comments: watch vitals  ? ?  ? ?Mobility ? Bed Mobility ?Overal bed mobility: Needs Assistance ?Bed Mobility: Supine to Sit ?  ?  ?Supine to sit: Min assist, HOB elevated ?  ?  ?General bed mobility comments: assist needed due to confusion and lines ?  ? ?Transfers ?  ?  ?  ?  ?  ?  ?  ?  ?  ?General transfer comment: unable to progress beyond EOB due to desat ?  ? ?Ambulation/Gait ?  ?  ?  ?  ?  ?  ?  ?  ? ?Stairs ?  ?  ?  ?  ?  ? ?Wheelchair Mobility ?  ? ?Modified Rankin (Stroke Patients Only) ?  ? ?  ? ?Balance Overall balance assessment: Needs assistance ?Sitting-balance support: No upper extremity supported, Feet supported ?Sitting balance-Leahy Scale: Good ?  ?  ?  ?  ?  ?  ?  ?  ?  ?  ?  ?  ?  ?  ?  ?  ?   ? ? ? ?Pertinent Vitals/Pain Pain Assessment ?Pain Assessment: Faces ?Faces Pain Scale: No hurt  ? ? ?Home Living Family/patient expects to be discharged to:: Private residence ?Living Arrangements: Spouse/significant other ?Available Help at Discharge: Family ?Type of Home: House ?  ?  ?  ?  ?  ?  ?Additional Comments: Home information taken from chart. Pt very confused and is a poor historian. No family present. Sitter present in room reports pt has been hallucinating.  ?  ?  Prior Function   ?  ?  ?  ?  ?  ?  ?  ?  ?  ? ? ?Hand Dominance  ?   ? ?  ?Extremity/Trunk Assessment  ? Upper Extremity Assessment ?Upper Extremity Assessment: Defer to OT evaluation ?  ? ?Lower Extremity Assessment ?Lower Extremity Assessment: Overall WFL for tasks assessed ?  ? ?Cervical / Trunk Assessment ?Cervical / Trunk Assessment: Normal  ?Communication  ?    ?Cognition Arousal/Alertness: Awake/alert ?Behavior During Therapy: Anxious, Restless ?Overall Cognitive Status: No family/caregiver present to determine baseline cognitive functioning ?  ?  ?  ?  ?  ?  ?  ?  ?  ?  ?  ?  ?  ?  ?  ?  ?General Comments: Very  confused. Tangential. Verbose. Following simple commands. Able to state the month/year and that she is in the hospital. ?  ?  ? ?  ?General Comments General comments (skin integrity, edema, etc.): Pt on 25L HHFNC + 15L NRB. SpO2 92% at rest. Desat to 84% sitting EOB. 3-4 minute recovery time in supine to improve to 89%. ? ?  ?Exercises    ? ?Assessment/Plan  ?  ?PT Assessment Patient needs continued PT services  ?PT Problem List Decreased mobility;Decreased safety awareness;Decreased knowledge of precautions;Decreased activity tolerance;Cardiopulmonary status limiting activity ? ?   ?  ?PT Treatment Interventions Therapeutic activities;DME instruction;Gait training;Therapeutic exercise;Patient/family education;Balance training;Stair training;Functional mobility training   ? ?PT Goals (Current goals can be found in the Care Plan section)  ?Acute Rehab PT Goals ?Patient Stated Goal: not stated ?PT Goal Formulation: Patient unable to participate in goal setting ?Time For Goal Achievement: 06/10/21 ?Potential to Achieve Goals: Good ? ?  ?Frequency Min 3X/week ?  ? ? ?Co-evaluation   ?  ?  ?  ?  ? ? ?  ?AM-PAC PT "6 Clicks" Mobility  ?Outcome Measure Help needed turning from your back to your side while in a flat bed without using bedrails?: None ?Help needed moving from lying on your back to sitting on the side of a flat bed without using bedrails?: A Little ?Help needed moving to and from a bed to a chair (including a wheelchair)?: A Little ?Help needed standing up from a chair using your arms (e.g., wheelchair or bedside chair)?: A Little ?Help needed to walk in hospital room?: A Little ?Help needed climbing 3-5 steps with a railing? : A Lot ?6 Click Score: 18 ? ?  ?End of Session Equipment Utilized During Treatment: Oxygen ?Activity Tolerance: Treatment limited secondary to medical complications (Comment) (desat, high O2 needs) ?Patient left: in bed;with call bell/phone within reach;with nursing/sitter in room ?Nurse  Communication: Mobility status ?PT Visit Diagnosis: Other abnormalities of gait and mobility (R26.89) ?  ? ?Time: 8638-1771 ?PT Time Calculation (min) (ACUTE ONLY): 17 min ? ? ?Charges:   PT Evaluation ?$PT Eval Moderate Complexity: 1 Mod ?  ?  ?   ? ? ?Lorrin Goodell, PT  ?Office # [phone] ?Pager [phone] ? ? ?Jaime Fitzpatrick ?05/27/2021, 2:42 PM ? ?

## 2021-05-27 NOTE — Progress Notes (Signed)
Progress Note  Patient Name: Jaime Fitzpatrick Date of Encounter: 05/27/2021  [facility] Cardiologist: [place] Hilty, MD  Subjective   Converted to NSR overnight.  Husband and son at bedside and say that she is significantly improved from yesterday  Inpatient Medications    Scheduled Meds:  acetylcysteine  4 mL Nebulization BID   acidophilus  1 capsule Oral Daily   arformoterol  15 mcg Nebulization BID   budesonide (PULMICORT) nebulizer solution  0.25 mg Nebulization BID   Chlorhexidine Gluconate Cloth  6 each Topical Q0600   cholecalciferol  1,000 Units Oral Daily   diltiazem  60 mg Oral Q8H   enoxaparin (LOVENOX) injection  40 mg Subcutaneous Q24H   furosemide  40 mg Intravenous Q8H   guaiFENesin  1,200 mg Oral BID   irbesartan  300 mg Oral Daily   methenamine  1,000 mg Oral BID   multivitamin with minerals  1 tablet Oral Daily   mupirocin ointment  1 application. Nasal BID   predniSONE  40 mg Oral Q breakfast   triamcinolone  1 spray Nasal Daily   umeclidinium bromide  1 puff Inhalation Daily   voriconazole  200 mg Oral Q12H   Continuous Infusions:  ceFEPime (MAXIPIME) IV 2 g (05/27/21 0849)   vancomycin 500 mg (05/27/21 0031)   PRN Meds: ipratropium-albuterol   Vital Signs    Vitals:   05/27/21 0625 05/27/21 0651 05/27/21 0726 05/27/21 0901  BP:   (!) 114/59   Pulse: 81 80 74   Resp: (!) 26 (!) 21 20   Temp:   97.6 F (36.4 C)   TempSrc:   Oral   SpO2: 97% 95% 92% 91%    Intake/Output Summary (Last 24 hours) at 05/27/2021 1025 Last data filed at 05/27/2021 0400 Gross per 24 hour  Intake 683.02 ml  Output 2900 ml  Net -2216.98 ml      05/18/2021   10:58 AM 05/09/2021    2:47 PM 07/04/2020    4:32 PM  Last 3 Weights  Weight (lbs) 140 lb 3.2 oz 140 lb 6.4 oz 134 lb  Weight (kg) 63.594 kg 63.685 kg 60.782 kg      Telemetry    NSR - Personally Reviewed  ECG    NSR with RBBB and LAFB, LAE, LVH by voltage- Personally Reviewed  Physical Exam    GEN: No acute distress.   Neck: No JVD Cardiac: RRR, no murmurs, rubs, or gallops.  Respiratory: coarse junky BS anteriorly GI: Soft, nontender, non-distended  MS: No edema; No deformity. Neuro:  Nonfocal  Psych: Normal affect   Labs    High Sensitivity Troponin:  No results for input(s): TROPONINIHS in the last 720 hours.    Chemistry Recent Labs  Lab 05/25/21 0919 05/25/21 1339 05/26/21 0236 05/27/21 0143  NA 138 134* 134* 137  K 4.2 4.6 4.3 3.7  CL 98  --  93* 90*  CO2 35*  --  38* 41*  GLUCOSE 98  --  138* 177*  BUN 10  --  12 11  CREATININE 0.50  --  0.41* 0.45  CALCIUM 8.4*  --  8.7* 8.6*  PROT 6.7  --   --   --   ALBUMIN 2.9*  --   --   --   AST 19  --   --   --   ALT 15  --   --   --   ALKPHOS 57  --   --   --  BILITOT 0.3  --   --   --   GFRNONAA >60  --  >60 >60  ANIONGAP 5  --  3* 6     Hematology Recent Labs  Lab Jun 14, 2021 0919 14-Jun-2021 1339 05/26/21 0236 05/27/21 0143  WBC 6.3  --  5.7 6.7  RBC 3.76*  --  3.73* 4.07  HGB 11.0* 11.9* 11.1* 11.9*  HCT 37.3 35.0* 37.0 39.9  MCV 99.2  --  99.2 98.0  MCH 29.3  --  29.8 29.2  MCHC 29.5*  --  30.0 29.8*  RDW 14.6  --  14.4 13.7  PLT 200  --  188 188    BNP Recent Labs  Lab 06/14/21 0919 05/26/21 0236 05/27/21 0143  BNP 294.3* 364.1* 484.1*     DDimer No results for input(s): DDIMER in the last 168 hours.   CHA2DS2-VASc Score = 5  his indicates a 7.2% annual risk of stroke. The patient's score is based upon: CHF History: 1 HTN History: 1 Diabetes History: 0 Stroke History: 0 Vascular Disease History: 0 Age Score: 2 Gender Score: 1   Radiology    CT Angio Chest Pulmonary Embolism (PE) W or WO Contrast  Result Date: 05/26/2021 CLINICAL DATA:  Pulmonary embolism suspected. EXAM: CT ANGIOGRAPHY CHEST WITH CONTRAST TECHNIQUE: Multidetector CT imaging of the chest was performed using the standard protocol during bolus administration of intravenous contrast. Multiplanar CT image  reconstructions and MIPs were obtained to evaluate the vascular anatomy. RADIATION DOSE REDUCTION: This exam was performed according to the departmental dose-optimization program which includes automated exposure control, adjustment of the mA and/or kV according to patient size and/or use of iterative reconstruction technique. CONTRAST:  50mL OMNIPAQUE IOHEXOL 350 MG/ML SOLN COMPARISON:  CT chest 04/04/2021 FINDINGS: Cardiovascular: Heart is enlarged. Coronary artery calcification is evident. Mild atherosclerotic calcification is noted in the wall of the thoracic aorta. Enlargement of the pulmonary outflow tract/main pulmonary arteries suggests pulmonary arterial hypertension. There is no filling defect within the opacified pulmonary arteries to suggest the presence of an acute pulmonary embolus. Mediastinum/Nodes: No mediastinal lymphadenopathy. There is no hilar lymphadenopathy. The esophagus has normal imaging features. There is no axillary lymphadenopathy. Lungs/Pleura: Bilateral lower lobe collapse/consolidation is progressive in the interval since prior CT scan. Patchy and nodular ground-glass and consolidative opacity in the upper lobes bilaterally is progressive with confluent consolidative disease in the left apex showing associated bronchiectasis and small cavitary components. The intracavitary nodule seen on the previous CT scan is obscured today. Small loculated pleural fluid collections are noted bilaterally. Upper Abdomen: Unremarkable. Musculoskeletal: No worrisome lytic or sclerotic osseous abnormality. Review of the MIP images confirms the above findings. IMPRESSION: 1. No CT evidence for acute pulmonary embolus. 2. Enlargement of the pulmonary outflow tract/main pulmonary arteries suggests pulmonary arterial hypertension. 3. Bilateral lower lobe collapse/consolidation is progressive in the interval since prior CT scan of 04/04/2021. 4. Patchy and nodular ground-glass and consolidative opacity in  the upper lobes bilaterally is progressive with confluent consolidative disease in the left apex showing associated bronchiectasis and small cavitary components. 5. Small loculated pleural fluid collections bilaterally. 6. Aortic Atherosclerosis (ICD10-I70.0). Electronically Signed   By: Kennith Center M.D.   On: 05/26/2021 09:05    Cardiac Studies   2D echo 05/22/2021 IMPRESSIONS    1. Right ventricular systolic function is moderately reduced. The right  ventricular size is mildly enlarged. There is severely elevated pulmonary  artery systolic pressure with a dilated pulmonary artery. The estimated  right ventricular  systolic pressure   is 62.1 mmHg.   2. Left ventricular ejection fraction, by estimation, is 60 to 65%. Left  ventricular ejection fraction by 3D volume is 61 %. The left ventricle has  normal function. The left ventricle has no regional wall motion  abnormalities. There is moderate  concentric left ventricular hypertrophy. Left ventricular diastolic  parameters are consistent with Grade I diastolic dysfunction (impaired  relaxation). Elevated left atrial pressure. There is the interventricular  septum is flattened in systole,  consistent with right ventricular pressure overload.   3. Left atrial size was mild to moderately dilated.   4. Right atrial size was severely dilated.   5. The mitral valve is normal in structure. Trivial mitral valve  regurgitation.   6. The aortic valve was not well visualized. Aortic valve regurgitation  is trivial.   7. Aortic dilatation noted. There is borderline dilatation of the aortic  root, measuring 36 mm. There is borderline dilatation of the ascending  aorta, measuring 38 mm.   Patient Profile     81 y.o. female COPD/Bronchiectasis, admitted with pneumonia and noted to go into afib with RVR  Assessment & Plan    Atrial fibrillation with RVR -likely provoked by PNA and COPD exacerbation as well as mild to moderate LAE. -CHADS2VASC  score is 5 -started on IV Cardizem gtt yesterday and transitioned to cardizem 60mg  q8 hours and converted to NSR -HR remains controlled -start Apixaban 5mg  BID for CHADS2VASC score of 5>>she is at risk in the future for recurrent episodes given underlying chronic pulmonary issues and severe RAE  Acute hypoxic Respiratory Failure COPD exacerbation PNA Acute diastolic CHF -she has a hx of bronchiectasis and COPD with chronic respiratory failure -BNP elevated at 294 -normal LVF on echo but RV failure with cor pulmonale and severe PHTN -currently on IV Lasix 40mg  q8 hours -she put out 2.9L yesterday and is net neg 2.7L since admit -weight not recorded -SCr stable at 0.45 and K+ 3.7 -Continue IV Lasix and follow strict I&O's, daily weights and renal function  HTN -BP controlled -continue Cardizem 60mg  q8 hrs and consolidate at discharge -continue Irbesartan 300mg  daily  Severe Pulmonary HTN -PASP in the 60's on echo this admit with evidence of RV pressure overload IVS flattening in systole, severe RAE, mild RVE and moderate RV systolic dysfunction c/w cor pulmonale -likely related to chronic respiratory failure/COPD/Bronchiectasis and diastolic CHF -continue diuretics and management of pulmonary issues  I have spent a total of 40 minutes with patient reviewing 2D echo , telemetry, EKGs, labs and examining patient as well as establishing an assessment and plan that was discussed with the patient.  > 50% of time was spent in direct patient care.         For questions or updates, please contact [facility] Please consult [email or web address] for contact info under        Signed, Armanda Magic, MD  05/27/2021, 10:25 AM
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
## 2021-05-27 NOTE — Progress Notes (Signed)
ANTICOAGULATION CONSULT NOTE - Initial Consult ? ?Pharmacy Consult for apixaban ?Indication: atrial fibrillation ? ?Allergies  ?Allergen Reactions  ? Acetaminophen-Codeine Itching  ? Amoxicillin Itching  ? Ampicillin Itching  ? Avelox [Moxifloxacin] Hives and Dermatitis  ? Codeine   ?  Other reaction(s): Other (See Comments), Unknown ?Asthma ?Codeine with Tyenol patient states "I am able to take plain codeine ?  ? Erythromycin Ethylsuccinate Hives  ? Lorazepam   ?  Other reaction(s): in effective for sleep  ? ? ?Patient Measurements: ? Weight 63.6 kg ? ?Vital Signs: ?Temp: 97.6 ?F (36.4 ?C) (04/29 6712) ?Temp Source: Oral (04/29 0726) ?BP: 114/59 (04/29 0726) ?Pulse Rate: 74 (04/29 0726) ? ?Labs: ?Recent Labs  ?  04/30/2021 ?0919 05/26/2021 ?1339 05/26/21 ?0236 05/27/21 ?0143  ?HGB 11.0* 11.9* 11.1* 11.9*  ?HCT 37.3 35.0* 37.0 39.9  ?PLT 200  --  188 188  ?APTT 33  --   --   --   ?LABPROT 13.6  --   --   --   ?INR 1.1  --   --   --   ?CREATININE 0.50  --  0.41* 0.45  ? ? ?Estimated Creatinine Clearance: 56.3 mL/min (by C-G formula based on SCr of 0.45 mg/dL). ? ? ?Medical History: ?Past Medical History:  ?Diagnosis Date  ? ARDS (adult respiratory distress syndrome) ([place])   ? Bronchiectasis   ? Hypertension   ? 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 Nephrolithiasis   ? Palpitations   ? Pneumococcal pneumonia ([place])   ? Reflux   ? Shortness of breath dyspnea   ? going up stairs  ? ? ?Medications:  ?Medications Prior to Admission  ?Medication Sig Dispense Refill Last Dose  ? albuterol (VENTOLIN HFA) 108 (90 Base) MCG/ACT inhaler Inhale 2 puffs into the lungs every 6 (six) hours as needed for wheezing or shortness of breath. 20.1 g 1 unk  ? antiseptic oral rinse (BIOTENE) LIQD 15 mLs by Mouth Rinse route daily as needed for dry mouth.   Past Week  ? Ascorbic Acid (VITAMIN C PO) Take 500 mg by mouth daily.   05/24/2021  ? b complex vitamins tablet Take 1 tablet by mouth daily.   05/24/2021  ? budesonide-formoterol  (SYMBICORT) 80-4.5 MCG/ACT inhaler INHALE 2 PUFFS BY MOUTH EVERY MORNING THEN 2 PUFFS 12 HOURS LATER (Patient taking differently: Inhale 2 puffs into the lungs 2 (two) times daily.) 10.2 g 11 05/24/2021  ? Cholecalciferol (VITAMIN D PO) Take 1,000 Units by mouth daily.   05/24/2021  ? Coenzyme Q10 (COQ10) 100 MG CAPS Take 100 mg by mouth daily.   05/24/2021  ? Glucosamine-Chondroit-Vit C-Mn (GLUCOSAMINE 1500 COMPLEX PO) Take 1 tablet by mouth 2 (two) times daily.   05/24/2021  ? guaiFENesin (MUCINEX) 600 MG 12 hr tablet Take 1,200 mg by mouth 2 (two) times daily.   05/24/2021  ? methenamine (MANDELAMINE) 1 g tablet Take 1,000 mg by mouth daily.   05/24/2021  ? Multiple Vitamins-Calcium (MULTI-DAY/CALCIUM/EXTRA IRON) TABS Take 1 tablet by mouth daily.   05/24/2021  ? Probiotic Product (PROBIOTIC PO) Take 1 capsule by mouth daily.   05/24/2021  ? triamcinolone (NASACORT) 55 MCG/ACT AERO nasal inhaler Place 1 spray into the nose daily.   05/24/2021  ? triamcinolone cream (KENALOG) 0.1 % Apply 1 application. topically 2 (two) times daily as needed for rash.   05/24/2021  ? levofloxacin (LEVAQUIN) 500 MG tablet Take 1 tablet (500 mg total) by mouth daily. 5 tablet 0 05/23/2021  ?  Misc. Devices (ACAPELLA) MISC Use as directed 1 each 0   ? Respiratory Therapy Supplies (FLUTTER) DEVI Use as directed (Patient not taking: Reported on 05/09/2021) 1 each 0   ? telmisartan (MICARDIS) 80 MG tablet Take 80 mg by mouth daily. (Patient not taking: Reported on 05/18/2021)   Not Taking  ? ? ?Assessment: ?New onset Atrial fibrillation with RVR, likely provoked by PNA and COPD exacerbation as well as mild to moderate LAE. She did convert to NSR, however, with a CHADS2VASC score of 5, cards wants to start apixaban for future risk of recurrent episodes with underlying chronic pulmonary issues and atrial enlargement. ?Pt is 81 yo and 63.6 kg with SCr 0.45. ? ? ?Goal of Therapy:  ?Therapeutic anticoagulation ?Monitor platelets by anticoagulation  protocol: Yes ?  ?Plan:  ?Apixaban 5 mg PO BID  ?Monitor for bleeding ?Monitor renal function and weights. (Very close to cutoff weight for dose reduction) ? ? ?Thank you for allowing [place] to participate in this patients care. ?Jens Som, PharmD ?05/27/2021 11:38 AM ? ?**Pharmacist phone directory can be found on [email or web address] listed under [place]** ? ? ? ?

## 2021-05-27 NOTE — Progress Notes (Signed)
NT suctioning order received from Dr. Bridgett Larsson per RT request.  ?

## 2021-05-27 NOTE — Evaluation (Signed)
Clinical/Bedside Swallow Evaluation ?Patient Details  ?Name: Jaime Fitzpatrick ?MRN: [id number] ?Date of Birth: [birth year] ? ?Today's Date: 05/27/2021 ?Time: SLP Start Time (ACUTE ONLY): 2458 SLP Stop Time (ACUTE ONLY): 1209 ?SLP Time Calculation (min) (ACUTE ONLY): 42 min ? ?Past Medical History:  ?Past Medical History:  ?Diagnosis Date  ? ARDS (adult respiratory distress syndrome) ([place])   ? Bronchiectasis   ? Hypertension   ? 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 Nephrolithiasis   ? Palpitations   ? Pneumococcal pneumonia ([place])   ? Reflux   ? Shortness of breath dyspnea   ? going up stairs  ? ?Past Surgical History:  ?Past Surgical History:  ?Procedure Laterality Date  ? COLONOSCOPY    ? MASS EXCISION Right 08/05/2014  ? Procedure: EXCISION MASS POSTERIOR HIP;  Surgeon: Armandina Gemma, MD;  Location: [facility];  Service: General;  Laterality: Right;  ? NASAL SINUS SURGERY    ? TONSILLECTOMY    ? ?HPI:  ?Pt is an 81 yo female persenting with worsening cough and shortness of breath for 2 weeks PTA. Found to have acute hypoxic respiratory failure due to bronchiectasis flare with bilateral PNA. PMH includes: ARDS (~30 years ago), bronchiectasis, COPD, home O2 2L during sleep and activity, reflux, HTN  ?  ?Assessment / Plan / Recommendation  ?Clinical Impression ? Pt's oropharyngeal swallow appears to be functional with no overt s/s of aspiration observed even when challenged with approximately three ounces of water, consumed without stopping. This alone is suggestive of lower risk for silent aspiration; however, when looking at her full clinical picture, pt does have other risk factors for aspiration including decreased respiratory status (hovering in the mid-80s while on 40L) and AMS. Her hx also puts her at risk for dysphagia related adverse events. Pt would not be able to transport to radiology with current O2 needs to more closely assess. Discussed with Dr. [place] Bells, who is in agreement with  proceeding slowly with PO intake to try to reduce the risk of aspiration as much as possible. Education was also provided to sons, who are in agreement and plan to relay the information to their father as well. For now, will leave on current diet with careful monitoring and assistance during meals. Would also offer meds in puree as an added precaution. ?SLP Visit Diagnosis: Dysphagia, unspecified (R13.10) ?   ?Aspiration Risk ? Moderate aspiration risk  ?  ?Diet Recommendation Regular;Thin liquid  ? ?Liquid Administration via: Cup;Straw ?Medication Administration: Whole meds with puree ?Supervision: Staff to assist with self feeding;Full supervision/cueing for compensatory strategies ?Compensations: Minimize environmental distractions;Slow rate;Small sips/bites ?Postural Changes: Seated upright at 90 degrees;Remain upright for at least 30 minutes after po intake  ?  ?Other  Recommendations Oral Care Recommendations: Oral care QID   ? ?Recommendations for follow up therapy are one component of a multi-disciplinary discharge planning process, led by the attending physician.  Recommendations may be updated based on patient status, additional functional criteria and insurance authorization. ? ?Follow up Recommendations  (tba)  ? ? ?  ?Assistance Recommended at Discharge Frequent or constant Supervision/Assistance  ?Functional Status Assessment Patient has had a recent decline in their functional status and demonstrates the ability to make significant improvements in function in a reasonable and predictable amount of time.  ?Frequency and Duration min 2x/week  ?2 weeks ?  ?   ? ?Prognosis    ? ?  ? ?Swallow Study   ?General HPI: Pt is an 81 yo  female persenting with worsening cough and shortness of breath for 2 weeks PTA. Found to have acute hypoxic respiratory failure due to bronchiectasis flare with bilateral PNA. PMH includes: ARDS (~30 years ago), bronchiectasis, COPD, home O2 2L during sleep and activity, reflux,  HTN ?Type of Study: Bedside Swallow Evaluation ?Previous Swallow Assessment: none in chart ?Diet Prior to this Study: Regular;Thin liquids ?Temperature Spikes Noted: No ?Respiratory Status: Nasal cannula (HHFNC) ?History of Recent Intubation: No ?Behavior/Cognition: Alert;Cooperative;Pleasant mood;Confused;Distractible ?Oral Cavity Assessment: Within Functional Limits ?Oral Care Completed by SLP: No ?Oral Cavity - Dentition: Adequate natural dentition ?Vision: Functional for self-feeding ?Self-Feeding Abilities: Needs assist ?Patient Positioning: Upright in bed ?Baseline Vocal Quality: Normal ?Volitional Cough: Strong ?Volitional Swallow: Able to elicit  ?  ?Oral/Motor/Sensory Function Overall Oral Motor/Sensory Function: Within functional limits   ?Ice Chips Ice chips: Within functional limits ?Presentation: Spoon   ?Thin Liquid Thin Liquid: Within functional limits ?Presentation: Cup;Straw  ?  ?Nectar Thick Nectar Thick Liquid: Not tested   ?Honey Thick Honey Thick Liquid: Not tested   ?Puree Puree: Within functional limits ?Presentation: Spoon   ?Solid ? ? ?  Solid: Within functional limits ?Presentation: Self Fed  ? ?  ? ?Osie Bond., M.A. CCC-SLP ?Acute Rehabilitation Services ?Office [phone] ? ?Secure chat preferred ? ?05/27/2021,1:05 PM ? ? ? ?

## 2021-05-27 NOTE — Progress Notes (Signed)
Patient was able to do flutter valve with breathing treatment for 10 mins well. Patient was getting out of breath so we took flutter attachment off after 10 mins and continued breathing treatment the regular way. ?

## 2021-05-27 NOTE — Progress Notes (Signed)
RT note. Abg drawn at this time. ?Lab notified ?

## 2021-05-27 NOTE — IPAL (Signed)
?  Interdisciplinary Goals of Care Family Meeting ? ? ?Date carried out: 05/27/2021 ? ?Location of the meeting: Bedside ? ?Member's involved: Physician ? ?Durable Power of Attorney or acting medical decision maker: Husband Dr. Magdalene River   ? ?Discussion: We discussed goals of care for Jaime Fitzpatrick .  I explained that while she can recover from this illness, her advanced lung disease and age will make it much more difficult for her to survive should she have a respiratory arrest requiring intubation and mechanical ventilation.  They stated that in the past she had indicated that she would not want prolonged life support.  If she were to require intubation it would not be a short course.  So based on our conversation they elected to change her code status to DNR, but continue full medical support. ? ?Code status: Full DNR ? ?Disposition: Continue current acute care ? ?Time spent for the meeting: 30 minutes ? ? ? ?Jaime Awkward, MD ? ?05/27/2021, 1:32 PM ? ? ?

## 2021-05-27 NOTE — Progress Notes (Signed)
RT note. ?Patient able to do flutter valve/nebulizer. Patient demonstrates good technique. RT will continue to monitor.  ?

## 2021-05-27 NOTE — Progress Notes (Signed)
?   05/26/21 2000  ?Assess: MEWS Score  ?BP 129/84  ?Pulse Rate (!) 106  ?ECG Heart Rate (!) 138  ?Resp (!) 46  ?SpO2 98 %  ?O2 Device HFNC  ?Assess: MEWS Score  ?MEWS Temp 0  ?MEWS Systolic 0  ?MEWS Pulse 3  ?MEWS RR 3  ?MEWS LOC 0  ?MEWS Score 6  ?MEWS Score Color Red  ?Assess: if the MEWS score is Yellow or Red  ?Were vital signs taken at a resting state? Yes  ?Focused Assessment Change from prior assessment (see assessment flowsheet)  ?Early Detection of Sepsis Score *See Row Information* High  ?MEWS guidelines implemented *See Row Information* Yes  ?Take Vital Signs  ?Increase Vital Sign Frequency  Red: Q 1hr X 4 then Q 4hr X 4, if remains red, continue Q 4hrs  ?Escalate  ?MEWS: Escalate Red: discuss with charge nurse/RN and provider, consider discussing with RRT  ?Notify: Charge Nurse/RN  ?Name of Charge Nurse/RN Notified Manuela Schwartz RN  ?Date Charge Nurse/RN Notified 05/26/21  ?Time Charge Nurse/RN Notified 2010  ?Notify: Provider  ?Provider Name/Title Dr. Bridgett Larsson  ?Date Provider Notified 05/26/21  ?Time Provider Notified 2050  ?Notification Type Page  ?Notification Reason Critical result;Change in status  ?Provider response No new orders ?(He stated to call PCCM if she starts deteriorates)  ?Date of Provider Response 05/26/21  ?Time of Provider Response 2100  ?Notify: Rapid Response  ?Name of Rapid Response RN Notified Anselm Pancoast RN  ?Date Rapid Response Notified 05/26/21  ?Time Rapid Response Notified 2011  ?Document  ?Patient Outcome Not stable and remains on department  ?Progress note created (see row info) Yes  ? ?Patient went to respiratory distress with her sats in the 70 s while on HFNC. Rapid call as well as Dr. Bridgett Larsson. Also notified Dr. Bridgett Larsson of her CO2 of 79 and Bicarb of 45.7. Dr. Debara Pickett was at the bedside at the time of her distress. New order for ABG received. Will continue to monitor. ?

## 2021-05-28 DIAGNOSIS — I4891 Unspecified atrial fibrillation: Secondary | ICD-10-CM | POA: Diagnosis not present

## 2021-05-28 DIAGNOSIS — I272 Pulmonary hypertension, unspecified: Secondary | ICD-10-CM | POA: Diagnosis not present

## 2021-05-28 DIAGNOSIS — J9601 Acute respiratory failure with hypoxia: Secondary | ICD-10-CM | POA: Diagnosis not present

## 2021-05-28 LAB — BASIC METABOLIC PANEL
Anion gap: 9 (ref 5–15)
BUN: 22 mg/dL (ref 8–23)
CO2: 40 mmol/L — ABNORMAL HIGH (ref 22–32)
Calcium: 9.2 mg/dL (ref 8.9–10.3)
Chloride: 87 mmol/L — ABNORMAL LOW (ref 98–111)
Creatinine, Ser: 0.58 mg/dL (ref 0.44–1.00)
GFR, Estimated: >60 mL/min (ref >60)
Glucose, Bld: 140 mg/dL — ABNORMAL HIGH (ref 70–99)
Potassium: 3.6 mmol/L (ref 3.5–5.1)
Sodium: 136 mmol/L (ref 135–145)

## 2021-05-28 LAB — CBC
HCT: 37.9 % (ref 36.0–46.0)
Hemoglobin: 12.1 g/dL (ref 12.0–15.0)
MCH: 29.7 pg (ref 26.0–34.0)
MCHC: 31.9 g/dL (ref 30.0–36.0)
MCV: 92.9 fL (ref 80.0–100.0)
Platelets: 205 10*3/uL (ref 150–400)
RBC: 4.08 MIL/uL (ref 3.87–5.11)
RDW: 13.9 % (ref 11.5–15.5)
WBC: 7.8 10*3/uL (ref 4.0–10.5)
nRBC: 0 % (ref 0.0–0.2)

## 2021-05-28 LAB — BRAIN NATRIURETIC PEPTIDE: B Natriuretic Peptide: 263 pg/mL — ABNORMAL HIGH (ref 0.0–100.0)

## 2021-05-28 LAB — MAGNESIUM: Magnesium: 2 mg/dL (ref 1.7–2.4)

## 2021-05-28 MED ORDER — LORAZEPAM 1 MG PO TABS: 1.0000 mg | ORAL_TABLET | ORAL | Status: DC | PRN

## 2021-05-28 MED ORDER — SODIUM CHLORIDE 0.9 % IV SOLN: 250.0000 mL | INTRAVENOUS | Status: DC | PRN

## 2021-05-28 MED ORDER — HALOPERIDOL LACTATE 5 MG/ML IJ SOLN: 0.5000 mg | INTRAMUSCULAR | Status: DC | PRN

## 2021-05-28 MED ORDER — QUETIAPINE FUMARATE 50 MG PO TABS: 50.0000 mg | ORAL_TABLET | Freq: Every day | ORAL | Status: DC

## 2021-05-28 MED ORDER — SODIUM CHLORIDE 0.9% FLUSH: 3.0000 mL | INTRAVENOUS | Status: DC | PRN

## 2021-05-28 MED ORDER — HYDROMORPHONE HCL 1 MG/ML IJ SOLN: 1.0000 mg | INTRAMUSCULAR | Status: DC | PRN

## 2021-05-28 MED ORDER — APIXABAN 2.5 MG PO TABS: 2.5000 mg | ORAL_TABLET | Freq: Two times a day (BID) | ORAL | Status: DC

## 2021-05-28 MED ORDER — DIPHENHYDRAMINE HCL 50 MG/ML IJ SOLN: 12.5000 mg | INTRAMUSCULAR | Status: DC | PRN

## 2021-05-28 MED ORDER — GLYCOPYRROLATE 0.2 MG/ML IJ SOLN: 0.2000 mg | INTRAMUSCULAR | Status: DC | PRN

## 2021-05-28 MED ORDER — GLYCOPYRROLATE 1 MG PO TABS
1.0000 mg | ORAL_TABLET | ORAL | Status: DC | PRN
Start: 2021-05-28 — End: 2021-05-28
  Filled 2021-05-28: qty 1

## 2021-05-28 MED ORDER — HYDROMORPHONE HCL 1 MG/ML IJ SOLN
1.0000 mg | Freq: Once | INTRAMUSCULAR | Status: AC
  Administered 2021-05-28: 1 mg via INTRAVENOUS
  Filled 2021-05-28: qty 1

## 2021-05-28 MED ORDER — ONDANSETRON HCL 4 MG/2ML IJ SOLN: 4.0000 mg | Freq: Four times a day (QID) | INTRAMUSCULAR | Status: DC | PRN

## 2021-05-28 MED ORDER — LORAZEPAM 2 MG/ML IJ SOLN: 1.0000 mg | INTRAMUSCULAR | Status: DC | PRN

## 2021-05-28 MED ORDER — ONDANSETRON 4 MG PO TBDP: 4.0000 mg | ORAL_TABLET | Freq: Four times a day (QID) | ORAL | Status: DC | PRN

## 2021-05-28 MED ORDER — LORAZEPAM 2 MG/ML PO CONC: 1.0000 mg | ORAL | Status: DC | PRN

## 2021-05-28 MED ORDER — HYDROMORPHONE BOLUS VIA INFUSION
1.0000 mg | INTRAVENOUS | Status: DC | PRN
  Administered 2021-05-28: 2 mg via INTRAVENOUS
  Filled 2021-05-28: qty 2

## 2021-05-28 MED ORDER — SODIUM CHLORIDE 0.9 % IV SOLN
1.0000 mg/h | INTRAVENOUS | Status: DC
  Administered 2021-05-28: 1 mg/h via INTRAVENOUS
  Filled 2021-05-28: qty 5

## 2021-05-28 MED ORDER — ALBUTEROL SULFATE (2.5 MG/3ML) 0.083% IN NEBU: 2.5000 mg | INHALATION_SOLUTION | RESPIRATORY_TRACT | Status: DC | PRN

## 2021-05-28 MED ORDER — ACETAMINOPHEN 325 MG PO TABS: 650.0000 mg | ORAL_TABLET | Freq: Four times a day (QID) | ORAL | Status: DC | PRN

## 2021-05-28 MED ORDER — ACETAMINOPHEN 650 MG RE SUPP: 650.0000 mg | Freq: Four times a day (QID) | RECTAL | Status: DC | PRN

## 2021-05-28 MED ORDER — LORAZEPAM 2 MG/ML IJ SOLN
1.0000 mg | INTRAMUSCULAR | Status: DC | PRN
  Administered 2021-05-28: 1 mg via INTRAVENOUS
  Filled 2021-05-28: qty 1

## 2021-05-28 MED ORDER — NYSTATIN 100000 UNIT/GM EX POWD
Freq: Three times a day (TID) | CUTANEOUS | Status: DC | PRN
  Filled 2021-05-28: qty 15

## 2021-05-28 MED ORDER — HALOPERIDOL 0.5 MG PO TABS
0.5000 mg | ORAL_TABLET | ORAL | Status: DC | PRN
  Filled 2021-05-28: qty 1

## 2021-05-28 MED ORDER — HALOPERIDOL LACTATE 2 MG/ML PO CONC
0.5000 mg | ORAL | Status: DC | PRN
  Filled 2021-05-28: qty 0.3

## 2021-05-28 MED ORDER — SODIUM CHLORIDE 0.9% FLUSH
3.0000 mL | Freq: Two times a day (BID) | INTRAVENOUS | Status: DC
  Administered 2021-05-28: 3 mL via INTRAVENOUS

## 2021-05-29 NOTE — Progress Notes (Signed)
Pharmacy Antibiotic Note ? ?Jaime Fitzpatrick is a 81 y.o. female admitted on 05/10/2021 with pneumonia.  Pharmacy consulted on 4/27 for cefepime and vancomycin dosing for PNA. Pharmacy consulted 4/28 by PCCM to add Voriconazole dosing for fungal infection on CT imaging.  ?Weight 63.6 kg ?Taking po meds currently. ?Respiratory cultures are negative, afeb. Blood cultures negative x 3 days ? ? ?Plan: ?Continue cefepime 2 grams IV every 12 hours ?Continue vancomycin 500 mg IV every 12 hours ?Continue Voriconazole '200mg'$  po every 12 hours ?F/u Length of treatment ?Respiratory cultures negative, d/c voriconazole? ? ?  ? ?Temp (24hrs), Avg:98 ?F (36.7 ?C), Min:97.5 ?F (36.4 ?C), Max:98.2 ?F (36.8 ?C) ? ?Recent Labs  ?Lab 05/06/2021 ?0904 05/17/2021 ?0919 05/26/21 ?0236 05/27/21 ?0143 06/21/21 ?8937  ?WBC  --  6.3 5.7 6.7 7.8  ?CREATININE  --  0.50 0.41* 0.45 0.58  ?LATICACIDVEN 0.8  --   --   --   --   ? ?  ?Estimated Creatinine Clearance: 56.3 mL/min (by C-G formula based on SCr of 0.58 mg/dL).   ? ?Allergies  ?Allergen Reactions  ? Acetaminophen-Codeine Itching  ? Amoxicillin Itching  ? Ampicillin Itching  ? Avelox [Moxifloxacin] Hives and Dermatitis  ? Codeine   ?  Other reaction(s): Other (See Comments), Unknown ?Asthma ?Codeine with Tyenol patient states "I am able to take plain codeine ?  ? Erythromycin Ethylsuccinate Hives  ? Lorazepam   ?  Other reaction(s): in effective for sleep  ? ? ?Antimicrobials this admission: ?4/27 Vanc >> ?4/27 Cefepime >> ?PTA Mandelamine >> ?4/28 Bactroban nasal >>(5/2) ?4/28 Voriconazole >> ?  ?  ?Dose adjustments this admission: ? ? ?Microbiology results:  ? 4/27 sputum: negative ? 4/27 blood: ng x 3 days ? 4/27 urine: neg ? 4/27 COVID and flu: neg ? 4/27 MRSA PCR: positive ? ? ? ?Thank you for allowing [place] to participate in this patients care. ?Jens Som, PharmD ?Jun 21, 2021 8:12 AM ? ?**Pharmacist phone directory can be found on [email or web address] listed under [place]** ? ? ?

## 2021-05-29 NOTE — Progress Notes (Signed)
OT Cancellation Note ? ?Patient Details ?Name: Jaime Fitzpatrick ?MRN: [id number] ?DOB: [birth year] ? ? ?Cancelled Treatment:    Reason Eval/Treat Not Completed: Medical issues which prohibited therapy. Pt going comfort care, OT will sign off at this time.  ? ?Jaime Fitzpatrick ?06/21/2021, 9:41 AM ?

## 2021-05-29 NOTE — Progress Notes (Signed)
Worsening Hypoxia overnight-was also very delirious. Spouse at bedside-long discussion-he is requesting transitioning to comfort measures at this point. Given lack of clinical improvement-severity of disease-I suspect this is reasonable. The patients 2 son's also came a bit later-they will say their goodbyes-before full comfort measures will be started. RN aware of plan.  ? ?

## 2021-05-29 NOTE — Progress Notes (Signed)
?      ?                 PROGRESS NOTE ? ?      ?PATIENT DETAILS ?Name: Jaime Fitzpatrick ?Age: 81 y.o. ?Sex: female ?Date of Birth: Jul 29, 1940 ?Admit Date: 05/12/2021 ?Admitting Physician Lequita Halt, MD ?VFI:EPPI, Ravisankar, MD ? ?Brief Summary: ?Patient is a 81 y.o.  female with history of bronchiectasis, Gold stage II COPD, chronic hypoxic respiratory failure on 2 L of oxygen (with activity/sleep)-presenting with worsening cough/shortness of breath x2 weeks (failed outpatient treatment with Levaquin)-found to have acute hypoxic respiratory failure due to bronchiectasis flare with PNA.. ? ?Significant events: ?4/27>> admit to [facility] for worsening shortness of breath-hypoxemia-due to bronchiectasis//PNA. ?4/28>> worsening hypoxemia- started on heated high flow, A-fib RVR-Cardizem infusion.  In evening-developed confusion-worsening hypoxemia-subsequently transferred from heated high flow to NRB.   ?4/29>> back in sinus rhythm.  Transition from NRB to heated high flow. ?4/30>> confused-on heated high flow and NRB.  Long discussion with family-transition to comfort care. ? ?Significant studies: ?9/51>> Echo: RV systolic function is moderately reduced-RVSP 62.1, LVEF 60-65%. ?4/28>> CTA chest: No PE, enlarged pulmonary artery, bilateral lower lobe collapse/consolidation, patchy nodular groundglass/consolidative opacity in the upper lobes bilaterally. ? ?Significant microbiology data: ?4/27>> flu/influenza PCR: Negative ?4/27>> sputum culture: Pending ?4/27>> blood culture: No growth ?4/27>> urine culture: No growth ? ?Procedures: ?None ? ?Consults: ?PCCM, cardiology ? ?Subjective: ?Significant amount of delirium last night.  More hypoxic this morning-on heated high flow and NRB.  Still somewhat confused and restless. ? ?Objective: ?Vitals: ?Blood pressure (!) 157/90, pulse 77, temperature 97.9 ?F (36.6 ?C), temperature source Axillary, resp. rate 20, SpO2 91 %.  ? ?Exam: ?Gen Exam: Still somewhat confused-but able to  follow simple commands-restless. ?HEENT:atraumatic, normocephalic ?Chest: B/L clear to auscultation anteriorly ?CVS:S1S2 regular ?Abdomen:soft non tender, non distended ?Extremities:no edema ?Neurology: Non focal ?Skin: no rash  ? ?Pertinent Labs/Radiology: ? ?  Latest Ref Rng & Units Jun 03, 2021  ? 12:51 AM 05/27/2021  ?  1:43 AM 05/26/2021  ?  2:36 AM  ?CBC  ?WBC 4.0 - 10.5 K/uL 7.8   6.7   5.7    ?Hemoglobin 12.0 - 15.0 g/dL 12.1   11.9   11.1    ?Hematocrit 36.0 - 46.0 % 37.9   39.9   37.0    ?Platelets 150 - 400 K/uL 205   188   188    ?  ?Lab Results  ?Component Value Date  ? NA 136 06/03/21  ? K 3.6 Jun 03, 2021  ? CL 87 (L) Jun 03, 2021  ? CO2 40 (H) 06-03-21  ? ?  ? ? ?Assessment/Plan: ?Acute on chronic hypoxic respiratory failure due to bilateral PNA/bronchiectasis flare/COPD exacerbation/acute on chronic HFpEF:Unfortunately remains severely hypoxic-no significant improvement with diuresis/empiric antibiotics/antifungal agents-in fact has worsened overnight-and on a combination of heated high flow and NRB.  Long discussion with spouse/sons at bedside-they had a long conversation with Dr. Lake Bells from PCCM yesterday-given lack of any significant clinical improvement-poor overall long-term prognosis-they wish to transition to full comfort measures today.   ? ?Paroxysmal A-fib with RVR: Occurred on 4/28-treated due to PNA/hypoxemia.  Maintaining sinus rhythm-since being transition to comfort care-we will stop Cardizem and Eliquis.  Cardiology consulted during this hospital stay. ? ?Acute metabolic encephalopathy: Likely due to severe hypoxemia/PNA-continues to have delirium and confusion episodes. ? ?Severe pulmonary hypertension: Chronic issue-likely sequently of underlying bronchiectasis. ? ?HTN: BP stable-continue ARB and Cardizem. ? ?Palliative care: Admitted on 4/27 with  severe hypoxemia-family was aware of very tenuous clinical condition.  Dr. Lake Bells had a long discussion with family-and was made a DNR on  4/29.  Given the lack of any significant clinical improvement-continued worsening this morning-family has decided to transition to full comfort measures.  Starting Dilaudid infusion-RN aware of comfort care plans.  She is too hypoxic to tolerate any sort of transfer to a hospice facility-expect inpatient death ? ?BMI ?Estimated body mass index is 20.7 kg/m? as calculated from the following: ?  Height as of 05/18/21: '5\' 9"'$  (1.753 m). ?  Weight as of 05/18/21: 63.6 kg.  ? ?Code status: ?  Code Status: DNR  ? ?DVT Prophylaxis: ? ?  ?Family Communication: Son/spouse at bedside ? ? ?Disposition Plan: ?Status is: Inpatient ?Remains inpatient appropriate because: Transitioning to comfort measures-very hypoxic to tolerate transfer to residential hospice. ?  ?Planned Discharge Destination: Inpatient death ? ? ?Diet: ?Diet Order   ? ?       ?  Diet 2 gram sodium Room service appropriate? Yes; Fluid consistency: Thin  Diet effective now       ?  ? ?  ?  ? ?  ?  ? ? ?Antimicrobial agents: ?Anti-infectives (From admission, onward)  ? ? Start     Dose/Rate Route Frequency Ordered Stop  ? 05/27/21 2200  voriconazole (VFEND) tablet 200 mg  Status:  Discontinued       ?See Hyperspace for full Linked Orders Report.  ? 200 mg Oral Every 12 hours 05/26/21 1759 2021-06-12 1009  ? 05/26/21 1845  voriconazole (VFEND) tablet 400 mg       ?See Hyperspace for full Linked Orders Report.  ? 400 mg Oral Every 12 hours 05/26/21 1759 05/27/21 0823  ? 04/29/2021 2200  ceFEPIme (MAXIPIME) 2 g in sodium chloride 0.9 % 100 mL IVPB  Status:  Discontinued       ? 2 g ?200 mL/hr over 30 Minutes Intravenous Every 12 hours 05/05/2021 1027 06-12-21 1009  ? 05/27/2021 2200  vancomycin (VANCOREADY) IVPB 500 mg/100 mL  Status:  Discontinued       ? 500 mg ?100 mL/hr over 60 Minutes Intravenous Every 12 hours 04/30/2021 1027 2021/06/12 1009  ? 05/08/2021 1445  methenamine (MANDELAMINE) tablet 1,000 mg  Status:  Discontinued       ? 1,000 mg Oral 2 times daily 05/12/2021 1139  2021-06-12 1009  ? 05/10/2021 0915  vancomycin (VANCOCIN) IVPB 1000 mg/200 mL premix       ? 1,000 mg ?200 mL/hr over 60 Minutes Intravenous  Once 05/22/2021 0907 05/09/2021 1049  ? 05/23/2021 0915  ceFEPIme (MAXIPIME) 2 g in sodium chloride 0.9 % 100 mL IVPB       ? 2 g ?200 mL/hr over 30 Minutes Intravenous  Once 05/17/2021 0907 05/04/2021 1025  ? ?  ? ? ? ?MEDICATIONS: ?Scheduled Meds: ? sodium chloride flush  3 mL Intravenous Q12H  ? ?Continuous Infusions: ? sodium chloride    ? HYDROmorphone    ? ?PRN Meds:.sodium chloride, acetaminophen **OR** acetaminophen, albuterol, diphenhydrAMINE, glycopyrrolate **OR** glycopyrrolate **OR** glycopyrrolate, haloperidol **OR** haloperidol **OR** haloperidol lactate, HYDROmorphone, LORazepam **OR** LORazepam **OR** LORazepam, LORazepam, nystatin, ondansetron **OR** ondansetron (ZOFRAN) IV, sodium chloride flush ? ? ?I have personally reviewed following labs and imaging studies ? ?LABORATORY DATA: ?CBC: ?Recent Labs  ?Lab 05/01/2021 ?0919 05/09/2021 ?1339 05/26/21 ?0236 05/27/21 ?0143 2021/06/12 ?1610  ?WBC 6.3  --  5.7 6.7 7.8  ?NEUTROABS 4.5  --   --   --   --   ?  HGB 11.0* 11.9* 11.1* 11.9* 12.1  ?HCT 37.3 35.0* 37.0 39.9 37.9  ?MCV 99.2  --  99.2 98.0 92.9  ?PLT 200  --  188 188 205  ? ? ? ?Basic Metabolic Panel: ?Recent Labs  ?Lab 05/02/2021 ?0919 05/20/2021 ?1339 05/26/21 ?0236 05/27/21 ?0143 06-19-21 ?4742  ?NA 138 134* 134* 137 136  ?K 4.2 4.6 4.3 3.7 3.6  ?CL 98  --  93* 90* 87*  ?CO2 35*  --  38* 41* 40*  ?GLUCOSE 98  --  138* 177* 140*  ?BUN 10  --  '12 11 22  '$ ?CREATININE 0.50  --  0.41* 0.45 0.58  ?CALCIUM 8.4*  --  8.7* 8.6* 9.2  ?MG  --   --   --  2.0 2.0  ? ? ? ?GFR: ?Estimated Creatinine Clearance: 56.3 mL/min (by C-G formula based on SCr of 0.58 mg/dL). ? ?Liver Function Tests: ?Recent Labs  ?Lab 05/20/2021 ?0919  ?AST 19  ?ALT 15  ?ALKPHOS 57  ?BILITOT 0.3  ?PROT 6.7  ?ALBUMIN 2.9*  ? ? ?No results for input(s): LIPASE, AMYLASE in the last 168 hours. ?No results for input(s): AMMONIA  in the last 168 hours. ? ?Coagulation Profile: ?Recent Labs  ?Lab 05/10/2021 ?0919  ?INR 1.1  ? ? ? ?Cardiac Enzymes: ?No results for input(s): CKTOTAL, CKMB, CKMBINDEX, TROPONINI in the last 168 hours. ? ?BN
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
## 2021-05-29 NOTE — Progress Notes (Signed)
Notes reviewed and transitioning to full comfort care today.  Will sign off. ?
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
## 2021-05-29 NOTE — Plan of Care (Signed)
Pt agitated despite seroquel given.  Pt has intermittent, but frequently outbursts of screaming and attempting to remove oxygen from her face.  Pt did have one incident where she was able to free hand from restraints and remove oxygen.  Pt is not redirectable and unable to follow commands consistently.  MD was made aware, but due to respiratory status, no further interventions allowed.  Telesitter placed in room for added patient safety.  ?  ?Problem: Activity: ?Goal: Ability to tolerate increased activity will improve ?Outcome: Not Progressing ?  ?Problem: Respiratory: ?Goal: Levels of oxygenation will improve ?Outcome: Not Progressing ?  ?Problem: Activity: ?Goal: Ability to tolerate increased activity will improve ?Outcome: Not Progressing ?  ?Problem: Clinical Measurements: ?Goal: Respiratory complications will improve ?Outcome: Not Progressing ?  ?Problem: Activity: ?Goal: Risk for activity intolerance will decrease ?Outcome: Not Progressing ?  ?Problem: Coping: ?Goal: Level of anxiety will decrease ?Outcome: Not Progressing ?  ?

## 2021-05-29 NOTE — Progress Notes (Signed)
Family at bedside, had '1mg'$  dilaudid and '1mg'$  ativan, O2 removed per family wishes, drip to be started when it arrives on unit, patient showing some air hunger.  Patient restraints removed earlier this am while family at bedside. Family includes spouse who is an MD ?

## 2021-05-29 DEATH — deceased

## 2021-06-01 LAB — CULTURE, BLOOD (ROUTINE X 2)
Culture: NO GROWTH
Culture: NO GROWTH
Special Requests: ADEQUATE

## 2021-06-29 NOTE — Accreditation Note (Signed)
Restraints not reported to CMS ?Pursuant to regulation 482.13 (G) (3) use of soft wrist restraints was logged 05/30/2021. ?

## 2021-07-24 ENCOUNTER — Ambulatory Visit: Payer: Medicare Other | Admitting: Internal Medicine
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
